# Patient Record
Sex: Female | Born: 1995 | Hispanic: Yes | Marital: Married | Smoking: Never smoker
Health system: Southern US, Community
[De-identification: ages and names within clinical notes are randomized; demographics above are authoritative.]

## PROBLEM LIST (undated history)

## (undated) ENCOUNTER — Inpatient Hospital Stay (HOSPITAL_COMMUNITY): Payer: Self-pay

## (undated) DIAGNOSIS — Z789 Other specified health status: Secondary | ICD-10-CM

## (undated) HISTORY — PX: CHOLECYSTECTOMY: SHX55

## (undated) HISTORY — DX: Other specified health status: Z78.9

---

## 2017-03-24 ENCOUNTER — Other Ambulatory Visit: Payer: Self-pay

## 2017-03-24 ENCOUNTER — Emergency Department (HOSPITAL_COMMUNITY)
Admission: EM | Admit: 2017-03-24 | Discharge: 2017-03-24 | Disposition: A | Discharge status: Home / Self Care | Payer: Medicaid Other | Attending: Emergency Medicine | Admitting: Emergency Medicine

## 2017-03-24 ENCOUNTER — Encounter (HOSPITAL_COMMUNITY): Payer: Self-pay | Admitting: Emergency Medicine

## 2017-03-24 DIAGNOSIS — J069 Acute upper respiratory infection, unspecified: Secondary | ICD-10-CM

## 2017-03-24 DIAGNOSIS — J029 Acute pharyngitis, unspecified: Secondary | ICD-10-CM

## 2017-03-24 MED ORDER — DEXAMETHASONE 4 MG PO TABS: 4.0000 mg | ORAL_TABLET | Freq: Two times a day (BID) | ORAL | 0 refills | Status: DC

## 2017-03-24 MED ORDER — IBUPROFEN 600 MG PO TABS: 600.0000 mg | ORAL_TABLET | Freq: Four times a day (QID) | ORAL | 0 refills | Status: DC

## 2017-03-24 MED ORDER — LORATADINE-PSEUDOEPHEDRINE ER 5-120 MG PO TB12: 1.0000 | ORAL_TABLET | Freq: Two times a day (BID) | ORAL | 0 refills | Status: DC

## 2017-03-24 NOTE — Discharge Instructions (Signed)
Please use your mask until symptoms resolve. Please increase fluids. Use ibuprofen every 6 hours for fever, chills, or aching. Use salt water gargles and Chloraseptic spray for assistance with your throat pain. Use decadron and claritin D 2 times daily. Wash hands frequently. See your MD or return to the ED if any changes or problem.

## 2017-03-24 NOTE — ED Provider Notes (Signed)
[facility] EMERGENCY DEPARTMENT Provider Note   CSN: [id number] Arrival date & time: 03/24/17  1508     History   Chief Complaint Chief Complaint  Patient presents with  . Sore Throat    HPI Michelle Larson is a 22 y.o. female.  The history is provided by the patient.  Sore Throat  This is a new problem. The current episode started yesterday. The problem occurs hourly. The problem has been gradually worsening. Associated symptoms include headaches. Pertinent negatives include no chest pain, no abdominal pain and no shortness of breath. The symptoms are aggravated by swallowing. Nothing relieves the symptoms. Treatments tried: TheraFlu. The treatment provided no relief.    History reviewed. No pertinent past medical history.  There are no active problems to display for this patient.   Past Surgical History:  Procedure Laterality Date  . CHOLECYSTECTOMY      OB History    No data available       Home Medications    Prior to Admission medications   Not on File    Family History History reviewed. No pertinent family history.  Social History Social History   Tobacco Use  . Smoking status: Never Smoker  . Smokeless tobacco: Never Used  Substance Use Topics  . Alcohol use: No    Frequency: Never  . Drug use: No     Allergies   Patient has no known allergies.   Review of Systems Review of Systems  Constitutional: Positive for chills. Negative for activity change.       All ROS Neg except as noted in HPI  HENT: Positive for congestion, sneezing, sore throat and trouble swallowing. Negative for nosebleeds.   Eyes: Negative for photophobia and discharge.  Respiratory: Negative for cough, shortness of breath and wheezing.   Cardiovascular: Negative for chest pain and palpitations.  Gastrointestinal: Negative for abdominal pain and blood in stool.  Genitourinary: Negative for dysuria, frequency and hematuria.  Musculoskeletal: Positive for myalgias.  Negative for arthralgias, back pain and neck pain.  Skin: Negative.   Neurological: Positive for headaches. Negative for dizziness, seizures and speech difficulty.  Psychiatric/Behavioral: Negative for confusion and hallucinations.     Physical Exam Updated Vital Signs BP 106/64 (BP Location: Left Arm)   Pulse 99   Temp 98.3 F (36.8 C) (Oral)   Resp 16   Ht 5\' 2"  (1.575 m)   Wt 68 kg (150 lb)   LMP 02/17/2017   SpO2 100%   BMI 27.44 kg/m   Physical Exam  Constitutional: She is oriented to person, place, and time. She appears well-developed and well-nourished.  Non-toxic appearance.  HENT:  Head: Normocephalic.  Right Ear: Tympanic membrane and external ear normal.  Left Ear: Tympanic membrane and external ear normal.  Mouth/Throat: Uvula is midline. Posterior oropharyngeal erythema present. No tonsillar abscesses.  Nasal congestion present.  Eyes: Pupils are equal, round, and reactive to light. EOM and lids are normal.  Neck: Normal range of motion. Neck supple. Carotid bruit is not present.  Cardiovascular: Normal rate, regular rhythm, normal heart sounds, intact distal pulses and normal pulses.  Pulmonary/Chest: Breath sounds normal. No respiratory distress.  Abdominal: Soft. Bowel sounds are normal. There is no tenderness. There is no guarding.  Musculoskeletal: Normal range of motion.  Lymphadenopathy:       Head (right side): No submandibular adenopathy present.       Head (left side): No submandibular adenopathy present.    She has no cervical adenopathy.  Neurological:  She is alert and oriented to person, place, and time. She has normal strength. No cranial nerve deficit or sensory deficit.  Skin: Skin is warm and dry.  Psychiatric: She has a normal mood and affect. Her speech is normal.  Nursing note and vitals reviewed.    ED Treatments / Results  Labs (all labs ordered are listed, but only abnormal results are displayed) Labs Reviewed - No data to  display  EKG  EKG Interpretation None       Radiology No results found.  Procedures Procedures (including critical care time)  Medications Ordered in ED Medications - No data to display   Initial Impression / Assessment and Plan / ED Course  I have reviewed the triage vital signs and the nursing notes.  Pertinent labs & imaging results that were available during my care of the patient were reviewed by me and considered in my medical decision making (see chart for details).       Final Clinical Impressions(s) / ED Diagnoses MDm  Vital signs reviewed.  Pulse oximetry is 100% on room air.  Within normal limits by my interpretation. Examination favors upper respiratory infection.  Of asked the patient to use salt water gargles and Chloraseptic spray.  Patient will use Tylenol every 4 hours or ibuprofen every 6 hours for fever and/or aching.  Decongesting medication and steroids also suggested to the patient.  We discussed the importance of good handwashing.  We also discussed the importance of good hydration.  The patient is to return to the emergency department if any changes in condition, problems, or concerns.  Patient is in agreement with this plan.   Final diagnoses:  Pharyngitis, unspecified etiology  Upper respiratory tract infection, unspecified type    ED Discharge Orders        Ordered    loratadine-pseudoephedrine (CLARITIN-D 12 HOUR) 5-120 MG tablet  2 times daily     03/24/17 1616    dexamethasone (DECADRON) 4 MG tablet  2 times daily with meals     03/24/17 1616    ibuprofen (ADVIL,MOTRIN) 600 MG tablet  4 times daily     03/24/17 1616       Ivery QualeBryant, Temeka Pore, PA-C 03/25/17 1412    Raeford RazorKohut, Stephen, MD 04/01/17 [phone]

## 2017-03-24 NOTE — ED Triage Notes (Signed)
PT c/o nasal congestion and sore throat x2 days unrelieved by OTC medications.

## 2017-07-27 DIAGNOSIS — Z130 Encounter for screening for diseases of the blood and blood-forming organs and certain disorders involving the immune mechanism: Secondary | ICD-10-CM | POA: Diagnosis not present

## 2017-07-27 DIAGNOSIS — H547 Unspecified visual loss: Secondary | ICD-10-CM | POA: Diagnosis not present

## 2017-07-27 DIAGNOSIS — J339 Nasal polyp, unspecified: Secondary | ICD-10-CM | POA: Diagnosis not present

## 2017-07-27 DIAGNOSIS — Z0000 Encounter for general adult medical examination without abnormal findings: Secondary | ICD-10-CM | POA: Diagnosis not present

## 2017-07-27 DIAGNOSIS — Z113 Encounter for screening for infections with a predominantly sexual mode of transmission: Secondary | ICD-10-CM | POA: Diagnosis not present

## 2017-07-27 DIAGNOSIS — Z13228 Encounter for screening for other metabolic disorders: Secondary | ICD-10-CM | POA: Diagnosis not present

## 2017-07-27 DIAGNOSIS — Z13220 Encounter for screening for lipoid disorders: Secondary | ICD-10-CM | POA: Diagnosis not present

## 2017-08-11 DIAGNOSIS — H5213 Myopia, bilateral: Secondary | ICD-10-CM | POA: Diagnosis not present

## 2017-08-11 DIAGNOSIS — H52223 Regular astigmatism, bilateral: Secondary | ICD-10-CM | POA: Diagnosis not present

## 2017-08-31 DIAGNOSIS — J343 Hypertrophy of nasal turbinates: Secondary | ICD-10-CM | POA: Diagnosis not present

## 2017-10-05 DIAGNOSIS — Z30432 Encounter for removal of intrauterine contraceptive device: Secondary | ICD-10-CM | POA: Diagnosis not present

## 2018-04-21 DIAGNOSIS — F419 Anxiety disorder, unspecified: Secondary | ICD-10-CM | POA: Diagnosis not present

## 2018-04-21 DIAGNOSIS — R454 Irritability and anger: Secondary | ICD-10-CM | POA: Diagnosis not present

## 2018-05-19 ENCOUNTER — Emergency Department (HOSPITAL_COMMUNITY): Payer: Medicaid Other

## 2018-05-19 ENCOUNTER — Emergency Department (HOSPITAL_COMMUNITY)
Admission: EM | Admit: 2018-05-19 | Discharge: 2018-05-19 | Disposition: A | Discharge status: Home / Self Care | Payer: Medicaid Other | Attending: Emergency Medicine | Admitting: Emergency Medicine

## 2018-05-19 ENCOUNTER — Encounter (HOSPITAL_COMMUNITY): Payer: Self-pay | Admitting: Emergency Medicine

## 2018-05-19 ENCOUNTER — Other Ambulatory Visit: Payer: Self-pay

## 2018-05-19 DIAGNOSIS — O26851 Spotting complicating pregnancy, first trimester: Secondary | ICD-10-CM | POA: Diagnosis not present

## 2018-05-19 DIAGNOSIS — O2340 Unspecified infection of urinary tract in pregnancy, unspecified trimester: Secondary | ICD-10-CM | POA: Insufficient documentation

## 2018-05-19 DIAGNOSIS — O200 Threatened abortion: Secondary | ICD-10-CM | POA: Diagnosis not present

## 2018-05-19 DIAGNOSIS — B9689 Other specified bacterial agents as the cause of diseases classified elsewhere: Secondary | ICD-10-CM | POA: Diagnosis not present

## 2018-05-19 DIAGNOSIS — R8271 Bacteriuria: Secondary | ICD-10-CM | POA: Diagnosis not present

## 2018-05-19 DIAGNOSIS — Z3A00 Weeks of gestation of pregnancy not specified: Secondary | ICD-10-CM | POA: Insufficient documentation

## 2018-05-19 DIAGNOSIS — O4690 Antepartum hemorrhage, unspecified, unspecified trimester: Secondary | ICD-10-CM

## 2018-05-19 DIAGNOSIS — O209 Hemorrhage in early pregnancy, unspecified: Secondary | ICD-10-CM | POA: Diagnosis present

## 2018-05-19 DIAGNOSIS — O9989 Other specified diseases and conditions complicating pregnancy, childbirth and the puerperium: Secondary | ICD-10-CM | POA: Diagnosis not present

## 2018-05-19 DIAGNOSIS — O99891 Other specified diseases and conditions complicating pregnancy: Secondary | ICD-10-CM

## 2018-05-19 LAB — CBC
HCT: 42.6 % (ref 36.0–46.0)
Hemoglobin: 14.2 g/dL (ref 12.0–15.0)
MCH: 31.6 pg (ref 26.0–34.0)
MCHC: 33.3 g/dL (ref 30.0–36.0)
MCV: 94.7 fL (ref 80.0–100.0)
Platelets: 227 10*3/uL (ref 150–400)
RBC: 4.5 MIL/uL (ref 3.87–5.11)
RDW: 12.2 % (ref 11.5–15.5)
WBC: 7.8 10*3/uL (ref 4.0–10.5)
nRBC: 0 % (ref 0.0–0.2)

## 2018-05-19 LAB — URINALYSIS, ROUTINE W REFLEX MICROSCOPIC
Bilirubin Urine: NEGATIVE
Glucose, UA: NEGATIVE mg/dL
Ketones, ur: 20 mg/dL — AB
Nitrite: NEGATIVE
Protein, ur: NEGATIVE mg/dL
Specific Gravity, Urine: 1.019 (ref 1.005–1.030)
pH: 5 (ref 5.0–8.0)

## 2018-05-19 LAB — COMPREHENSIVE METABOLIC PANEL
ALT: 24 U/L (ref 0–44)
AST: 16 U/L (ref 15–41)
Albumin: 4.3 g/dL (ref 3.5–5.0)
Alkaline Phosphatase: 64 U/L (ref 38–126)
Anion gap: 9 (ref 5–15)
BUN: 11 mg/dL (ref 6–20)
CO2: 23 mmol/L (ref 22–32)
Calcium: 8.9 mg/dL (ref 8.9–10.3)
Chloride: 105 mmol/L (ref 98–111)
Creatinine, Ser: 0.48 mg/dL (ref 0.44–1.00)
GFR calc Af Amer: >60 mL/min (ref >60)
GFR calc non Af Amer: >60 mL/min (ref >60)
Glucose, Bld: 81 mg/dL (ref 70–99)
Potassium: 3.4 mmol/L — ABNORMAL LOW (ref 3.5–5.1)
Sodium: 137 mmol/L (ref 135–145)
Total Bilirubin: 0.6 mg/dL (ref 0.3–1.2)
Total Protein: 7.6 g/dL (ref 6.5–8.1)

## 2018-05-19 LAB — WET PREP, GENITAL
Clue Cells Wet Prep HPF POC: NONE SEEN
Sperm: NONE SEEN
Trich, Wet Prep: NONE SEEN
Yeast Wet Prep HPF POC: NONE SEEN

## 2018-05-19 LAB — HCG, QUANTITATIVE, PREGNANCY: hCG, Beta Chain, Quant, S: 57 m[IU]/mL — ABNORMAL HIGH (ref <5)

## 2018-05-19 MED ORDER — CEPHALEXIN 500 MG PO CAPS: 500.0000 mg | ORAL_CAPSULE | Freq: Two times a day (BID) | ORAL | 0 refills | Status: AC

## 2018-05-19 NOTE — Discharge Instructions (Signed)
You were seen in the ED today for vaginal bleeding with pregnancy; your beta hcg level was elevated indicating that you are pregnant; it is too early to see the embryo on ultrasound today .Please call [facility] tomorrow morning for a repeat blood test in 48 hours; it is also recommended to have a repeat ultrasound in 10-14 days to determine the location of the embryo.   Please take the Keflex as prescribed for the bacteria found in your urine. It is recommended that you get a repeat urine test after you finish your course of antibiotics to ensure that there is no more bacteria. Return to the ED for any worsening symptoms.

## 2018-05-19 NOTE — ED Notes (Signed)
Pt given crackers, peanut butter and gingerale 

## 2018-05-19 NOTE — ED Notes (Signed)
Patient back from ultrasound

## 2018-05-19 NOTE — ED Notes (Signed)
PELVIC CART AT BEDSIDE 

## 2018-05-19 NOTE — ED Notes (Signed)
Patient transported to Ultrasound 

## 2018-05-19 NOTE — ED Provider Notes (Signed)
[facility] EMERGENCY DEPARTMENT Provider Note   CSN: [id number] Arrival date & time: 05/19/18  1731    History   Chief Complaint Chief Complaint  Patient presents with   Vaginal Bleeding    HPI Michelle Larson is a 23 y.o. G3P2002 currently pregant  female who presents to the ED complaining of vaginal bleeding that began today. Pt reports she took 2 at home pregnancy tests 3 days ago which were positive. She then noticed a small amount of vaginal bleeding today which concerned her. Pt wears a liner normally and noticed a small amount of blood; has not soaked through the pad. Denies pelvic pain, abdominal pain, fever, chills, nausea, vomiting, dysuria, frequency, or any other associated symptoms.       History reviewed. No pertinent past medical history.  There are no active problems to display for this patient.   Past Surgical History:  Procedure Laterality Date   CHOLECYSTECTOMY       OB History    Gravida  1   Para      Term      Preterm      AB      Living        SAB      TAB      Ectopic      Multiple      Live Births               Home Medications    Prior to Admission medications   Medication Sig Start Date End Date Taking? Authorizing Provider  cetirizine (ZYRTEC) 10 MG tablet Take 10 mg by mouth daily.   Yes [provider]  cephALEXin (KEFLEX) 500 MG capsule Take 1 capsule (500 mg total) by mouth 2 (two) times daily for 7 days. 05/19/18 05/26/18  Hyman Hopes, [place] Shannahan, PA-C  dexamethasone (DECADRON) 4 MG tablet Take 1 tablet (4 mg total) by mouth 2 (two) times daily with a meal. Patient not taking: Reported on 05/19/2018 03/24/17   Ivery Quale, PA-C  ibuprofen (ADVIL,MOTRIN) 600 MG tablet Take 1 tablet (600 mg total) by mouth 4 (four) times daily. Patient not taking: Reported on 05/19/2018 03/24/17   Ivery Quale, PA-C  loratadine-pseudoephedrine (CLARITIN-D 12 HOUR) 5-120 MG tablet Take 1 tablet by mouth 2 (two) times  daily. Patient not taking: Reported on 05/19/2018 03/24/17   Ivery Quale, PA-C    Family History No family history on file.  Social History Social History   Tobacco Use   Smoking status: Never Smoker   Smokeless tobacco: Never Used  Substance Use Topics   Alcohol use: No    Frequency: Never   Drug use: No     Allergies   Patient has no known allergies.   Review of Systems Review of Systems  Constitutional: Negative for chills and fever.  HENT: Negative for congestion.   Eyes: Negative for redness.  Respiratory: Negative for cough and shortness of breath.   Cardiovascular: Negative for chest pain.  Gastrointestinal: Negative for abdominal pain, constipation, diarrhea, nausea and vomiting.  Genitourinary: Positive for vaginal bleeding. Negative for dysuria, frequency, pelvic pain, urgency and vaginal discharge.  Musculoskeletal: Negative for myalgias.  Skin: Negative for rash.  Neurological: Negative for headaches.     Physical Exam Updated Vital Signs BP 115/68 (BP Location: Right Arm)    Pulse 88    Temp 98.7 F (37.1 C) (Oral)    Resp 18    Ht 5\' 2"  (1.575 m)    Wt 81.6 kg  LMP 04/07/2018    SpO2 100%    BMI 32.92 kg/m   Physical Exam Vitals signs and nursing note reviewed. Exam conducted with a chaperone present.  Constitutional:      Appearance: She is not ill-appearing.  HENT:     Head: Normocephalic and atraumatic.  Eyes:     Conjunctiva/sclera: Conjunctivae normal.  Neck:     Musculoskeletal: Neck supple.  Cardiovascular:     Rate and Rhythm: Normal rate and regular rhythm.     Pulses: Normal pulses.  Pulmonary:     Effort: Pulmonary effort is normal.     Breath sounds: Normal breath sounds. No wheezing, rhonchi or rales.  Abdominal:     Palpations: Abdomen is soft.     Tenderness: There is no abdominal tenderness. There is no guarding or rebound.  Genitourinary:    Comments: Female chaperone present; normal external genitalia; small  amount of blood in vaginal vault; os is closed; no adnexal or cervical motion tenderness on bimanual exam Skin:    General: Skin is warm and dry.  Neurological:     Mental Status: She is alert.      ED Treatments / Results  Labs (all labs ordered are listed, but only abnormal results are displayed) Labs Reviewed  WET PREP, GENITAL - Abnormal; Notable for the following components:      Result Value   WBC, Wet Prep HPF POC RARE (*)    All other components within normal limits  COMPREHENSIVE METABOLIC PANEL - Abnormal; Notable for the following components:   Potassium 3.4 (*)    All other components within normal limits  HCG, QUANTITATIVE, PREGNANCY - Abnormal; Notable for the following components:   hCG, Beta Chain, Quant, S 57 (*)    All other components within normal limits  URINALYSIS, ROUTINE W REFLEX MICROSCOPIC - Abnormal; Notable for the following components:   APPearance HAZY (*)    Hgb urine dipstick LARGE (*)    Ketones, ur 20 (*)    Leukocytes,Ua TRACE (*)    Bacteria, UA RARE (*)    All other components within normal limits  CBC  RPR  HIV ANTIBODY (ROUTINE TESTING W REFLEX)  GC/CHLAMYDIA PROBE AMP ([facility]) NOT AT [facility]    EKG None  Radiology US Ob Comp < 14 Wks  Result Date: 05/19/2018 CLINICAL DATA:  Pregnant patient in first-trimester pregnancy with vaginal bleeding. EXAM: OBSTETRIC <14 WK [place] AND TRANSVAGINAL OB US TECHNIQUE: Both transabdominal and transvaginal ultrasound examinations were performed for complete evaluation of the gestation as well as the maternal uterus, adnexal regions, and pelvic cul-de-sac. Transvaginal technique was performed to assess early pregnancy. COMPARISON:  None. FINDINGS: Intrauterine gestational sac: None Yolk sac:  Not Visualized. Embryo:  Not Visualized. Cardiac Activity: Not Visualized. Subchorionic hemorrhage:  Not applicable. Maternal uterus/adnexae: The uterus is anteverted measuring 9.3 x 4.7 x 5.3 cm. Distal endometrial  thickening at 19.5 mm. No fluid or gestational sac in the endometrium. Right ovary is normal in size and contains a 1.4 cm corpus luteum. Left ovary is normal in size. Blood flow noted to both ovaries. No adnexal mass. Trace pelvic free fluid. IMPRESSION: No intrauterine pregnancy or findings suspicious for ectopic pregnancy. Findings are consistent with pregnancy of unknown location and may reflect early intrauterine pregnancy not yet visualized sonographically, occult ectopic pregnancy, or failed pregnancy. Recommend trending of beta HCG and follow-up ultrasound in 10-14 days. Electronically Signed   By: Narda Rutherford M.D.   On: 05/19/2018 21:40  [place] Ob Transvaginal  Result Date: 05/19/2018 CLINICAL DATA:  Pregnant patient in first-trimester pregnancy with vaginal bleeding. EXAM: OBSTETRIC <14 WK US AND TRANSVAGINAL OB US TECHNIQUE: Both transabdominal and transvaginal ultrasound examinations were performed for complete evaluation of the gestation as well as the maternal uterus, adnexal regions, and pelvic cul-de-sac. Transvaginal technique was performed to assess early pregnancy. COMPARISON:  None. FINDINGS: Intrauterine gestational sac: None Yolk sac:  Not Visualized. Embryo:  Not Visualized. Cardiac Activity: Not Visualized. Subchorionic hemorrhage:  Not applicable. Maternal uterus/adnexae: The uterus is anteverted measuring 9.3 x 4.7 x 5.3 cm. Distal endometrial thickening at 19.5 mm. No fluid or gestational sac in the endometrium. Right ovary is normal in size and contains a 1.4 cm corpus luteum. Left ovary is normal in size. Blood flow noted to both ovaries. No adnexal mass. Trace pelvic free fluid. IMPRESSION: No intrauterine pregnancy or findings suspicious for ectopic pregnancy. Findings are consistent with pregnancy of unknown location and may reflect early intrauterine pregnancy not yet visualized sonographically, occult ectopic pregnancy, or failed pregnancy. Recommend trending of beta HCG and  follow-up ultrasound in 10-14 days. Electronically Signed   By: Narda RutherfordMelanie  Sanford M.D.   On: 05/19/2018 21:40    Procedures Procedures (including critical care time)  Medications Ordered in ED Medications - No data to display   Initial Impression / Assessment and Plan / ED Course  I have reviewed the triage vital signs and the nursing notes.  Pertinent labs & imaging results that were available during my care of the patient were reviewed by me and considered in my medical decision making (see chart for details).  Pt is a 23 year old female who is currently pregnant presenting to the ED with vaginal bleeding. Has had 2 at home pregnancy tests this week; no confirmed blood test yet. Not complaining of any pain at the moment. HCG positive at 57 today. Will get ultrasound to rule out ectopic pregnancy at this point as well as screening labs/wet prep and pelvic exam.    U/A with trace leuks; will treat asymptomatic bacteruria given patient is pregnant. Wet prep without findings of trich, yeast, or BV. Ultrasound without findings of pregnancy; probably too early to visualize. Advised pt to follow up with OBGYN for repeat beta hcg in 48 hours as well as repeat ultrasound in 2 weeks. Pt in agreement with plan and stable for discharge home.        Final Clinical Impressions(s) / ED Diagnoses   Final diagnoses:  Asymptomatic bacteriuria during pregnancy  Vaginal bleeding in pregnancy  Threatened miscarriage    ED Discharge Orders         Ordered    cephALEXin (KEFLEX) 500 MG capsule  2 times daily     05/19/18 2157           Tanda RockersVenter, Joesiah Lonon, PA-C 05/20/18 0041    Raeford RazorKohut, Stephen, MD 05/23/18 [phone]

## 2018-05-19 NOTE — ED Triage Notes (Signed)
Had 2 pos home preg test this week.  Having vaginal bleeding that started today.  Has not bleed through more than one pad in an hour.  Reports bright red blood on toilet paper and some in the toilet with no clots.

## 2018-05-20 ENCOUNTER — Telehealth: Payer: Self-pay | Admitting: *Deleted

## 2018-05-20 ENCOUNTER — Telehealth: Payer: Self-pay | Admitting: Women's Health

## 2018-05-20 NOTE — Telephone Encounter (Signed)
pleae call needs to speak to a nurse to follow up her her er viist at [facility]

## 2018-05-20 NOTE — Telephone Encounter (Signed)
Informed patient that based on her u/s and lab work she needs repeat on Monday and depending on the number is rising, she will need repeat u/s in 10-14 days.  Placed on lab schedule for Monday

## 2018-05-21 LAB — HIV ANTIBODY (ROUTINE TESTING W REFLEX): HIV Screen 4th Generation wRfx: NONREACTIVE

## 2018-05-21 LAB — RPR: RPR Ser Ql: NONREACTIVE

## 2018-05-23 ENCOUNTER — Other Ambulatory Visit: Payer: Medicaid Other

## 2018-05-23 ENCOUNTER — Other Ambulatory Visit: Payer: Self-pay | Admitting: Obstetrics & Gynecology

## 2018-05-23 ENCOUNTER — Other Ambulatory Visit: Payer: Self-pay

## 2018-05-23 DIAGNOSIS — Z3490 Encounter for supervision of normal pregnancy, unspecified, unspecified trimester: Secondary | ICD-10-CM | POA: Diagnosis not present

## 2018-05-23 LAB — GC/CHLAMYDIA PROBE AMP (~~LOC~~) NOT AT ARMC
Chlamydia: NEGATIVE
Neisseria Gonorrhea: NEGATIVE

## 2018-05-24 ENCOUNTER — Telehealth: Payer: Self-pay | Admitting: Adult Health

## 2018-05-24 LAB — BETA HCG QUANT (REF LAB): hCG Quant: 30 m[IU]/mL

## 2018-05-24 NOTE — Telephone Encounter (Signed)
Patient called stating that she would like to know the results of her blood work.  °Please contact pt °

## 2018-05-25 NOTE — Telephone Encounter (Signed)
Called patient back and answered questions about dropping hcg levels. Advised that she needs repeat to ensure levels return to 0.

## 2018-05-25 NOTE — Telephone Encounter (Signed)
Pt requesting a call from a nurse to discuss her results in detail.

## 2018-05-25 NOTE — Telephone Encounter (Signed)
Left message that level is dropping and that she needs repeat in 1 week.

## 2018-05-27 ENCOUNTER — Encounter: Payer: Medicaid Other | Admitting: Adult Health

## 2018-05-31 ENCOUNTER — Encounter: Payer: Medicaid Other | Admitting: Adult Health

## 2018-06-01 ENCOUNTER — Other Ambulatory Visit: Payer: Medicaid Other

## 2018-06-01 DIAGNOSIS — O039 Complete or unspecified spontaneous abortion without complication: Secondary | ICD-10-CM

## 2018-06-02 LAB — BETA HCG QUANT (REF LAB): hCG Quant: 3 m[IU]/mL

## 2018-10-06 ENCOUNTER — Ambulatory Visit: Payer: Medicaid Other | Admitting: *Deleted

## 2018-10-06 ENCOUNTER — Other Ambulatory Visit: Payer: Self-pay

## 2018-10-06 VITALS — BP 112/67 | HR 94 | Ht 62.0 in | Wt 189.0 lb

## 2018-10-06 DIAGNOSIS — O3680X0 Pregnancy with inconclusive fetal viability, not applicable or unspecified: Secondary | ICD-10-CM

## 2018-10-06 DIAGNOSIS — Z3201 Encounter for pregnancy test, result positive: Secondary | ICD-10-CM | POA: Diagnosis not present

## 2018-10-06 LAB — POCT URINE PREGNANCY: Preg Test, Ur: POSITIVE — AB

## 2018-10-06 NOTE — Progress Notes (Signed)
   NURSE VISIT- PREGNANCY CONFIRMATION   SUBJECTIVE:  Michelle Larson is a 23 y.o. [phone] female at [redacted]w[redacted]d by certain LMP of Patient's last menstrual period was 08/25/2018 (exact date). Here for pregnancy confirmation.  Home pregnancy test: positive x 1  She reports no complaints.  She is taking prenatal vitamins.    OBJECTIVE:  BP 112/67 (BP Location: Left Arm, Patient Position: Sitting, Cuff Size: Normal)   Pulse 94   Ht 5\' 2"  (1.575 m)   Wt 189 lb (85.7 kg)   LMP 08/25/2018 (Exact Date)   BMI 34.57 kg/m   Appears well, in no apparent distress OB History  Gravida Para Term Preterm AB Living  4 2 2   1 2   SAB TAB Ectopic Multiple Live Births  1       2    # Outcome Date GA Lbr Len/2nd Weight Sex Delivery Anes PTL Lv  4 Current           3 SAB 06/2018          2 Term 02/18/16 [redacted]w[redacted]d  8 lb 7 oz (3.827 kg) M Vag-Spont  N LIV  1 Term 08/25/12 [redacted]w[redacted]d  8 lb 12 oz (3.969 kg) F Vag-Spont  N LIV    Results for orders placed or performed in visit on 10/06/18 (from the past 24 hour(s))  POCT urine pregnancy   Collection Time: 10/06/18  1:48 PM  Result Value Ref Range   Preg Test, Ur Positive (A) Negative    ASSESSMENT: Positive pregnancy test, [redacted]w[redacted]d by LMP    PLAN: Schedule for dating ultrasound in 1-2 weeks Prenatal vitamins: continue   Nausea medicines: not currently needed   OB packet given: Yes  Latisha Cresenzo  10/06/2018 1:51 PM

## 2018-10-12 ENCOUNTER — Ambulatory Visit (INDEPENDENT_AMBULATORY_CARE_PROVIDER_SITE_OTHER): Payer: Medicaid Other

## 2018-10-12 ENCOUNTER — Other Ambulatory Visit: Payer: Self-pay

## 2018-10-12 DIAGNOSIS — O3680X0 Pregnancy with inconclusive fetal viability, not applicable or unspecified: Secondary | ICD-10-CM

## 2018-10-12 DIAGNOSIS — Z3A01 Less than 8 weeks gestation of pregnancy: Secondary | ICD-10-CM

## 2018-10-12 NOTE — Progress Notes (Signed)
Chart reviewed for nurse visit. Agree with plan of care.   Griffin, Jennifer A, NP 10/12/2018 3:12 PM  

## 2018-10-12 NOTE — Progress Notes (Signed)
[place] 6+6 wks,single IUP w/ys,positive fht 116 bpm,normal ovaries bilat,crl 4.25 mm

## 2018-10-14 ENCOUNTER — Telehealth: Payer: Self-pay | Admitting: *Deleted

## 2018-10-14 NOTE — Telephone Encounter (Signed)
Patient states she had an "incident" yesterday and would like to come in for an ultrasound today to check on the pregnancy.  When asked what type of incident patient stated she was attempting to kill a fly and was moving quickly throughout her house.  Denies bleeding but is cramping today. Informed patient she has probably pulled a muscle or exerted that area which causing the cramping.  Pt is worried as she has had a miscarriage earlier this year. Informed patient that unfortunately, there is nothing we can do at this time to prevent a miscarriage but we can check the heartbeat if she would like.  Pt stated she would and will come later today.

## 2018-11-01 ENCOUNTER — Other Ambulatory Visit: Payer: Self-pay | Admitting: Obstetrics and Gynecology

## 2018-11-01 DIAGNOSIS — O3680X0 Pregnancy with inconclusive fetal viability, not applicable or unspecified: Secondary | ICD-10-CM

## 2018-11-02 ENCOUNTER — Other Ambulatory Visit: Payer: Self-pay

## 2018-11-02 ENCOUNTER — Ambulatory Visit (INDEPENDENT_AMBULATORY_CARE_PROVIDER_SITE_OTHER): Payer: Medicaid Other

## 2018-11-02 DIAGNOSIS — O3680X0 Pregnancy with inconclusive fetal viability, not applicable or unspecified: Secondary | ICD-10-CM

## 2018-11-02 DIAGNOSIS — Z3A09 9 weeks gestation of pregnancy: Secondary | ICD-10-CM | POA: Diagnosis not present

## 2018-11-02 NOTE — Progress Notes (Signed)
[place] 9+6 wks,measurements c/w dates,crl 25.66 mm,normal ovaries bilat,fhr 169 bpm

## 2018-11-23 ENCOUNTER — Ambulatory Visit: Payer: Medicaid Other | Admitting: *Deleted

## 2018-11-23 ENCOUNTER — Other Ambulatory Visit: Payer: Medicaid Other

## 2018-11-24 ENCOUNTER — Other Ambulatory Visit: Payer: Medicaid Other

## 2018-11-24 ENCOUNTER — Ambulatory Visit: Payer: Medicaid Other | Admitting: *Deleted

## 2018-11-24 ENCOUNTER — Encounter: Payer: Medicaid Other | Admitting: Advanced Practice Midwife

## 2018-12-07 ENCOUNTER — Ambulatory Visit: Payer: Medicaid Other | Admitting: *Deleted

## 2018-12-07 ENCOUNTER — Ambulatory Visit (INDEPENDENT_AMBULATORY_CARE_PROVIDER_SITE_OTHER): Payer: Medicaid Other | Admitting: Advanced Practice Midwife

## 2018-12-07 ENCOUNTER — Encounter: Payer: Self-pay | Admitting: Advanced Practice Midwife

## 2018-12-07 ENCOUNTER — Other Ambulatory Visit: Payer: Self-pay

## 2018-12-07 VITALS — BP 114/76 | HR 82 | Wt 185.0 lb

## 2018-12-07 DIAGNOSIS — Z3482 Encounter for supervision of other normal pregnancy, second trimester: Secondary | ICD-10-CM

## 2018-12-07 DIAGNOSIS — Z331 Pregnant state, incidental: Secondary | ICD-10-CM

## 2018-12-07 DIAGNOSIS — Z130 Encounter for screening for diseases of the blood and blood-forming organs and certain disorders involving the immune mechanism: Secondary | ICD-10-CM

## 2018-12-07 DIAGNOSIS — Z3490 Encounter for supervision of normal pregnancy, unspecified, unspecified trimester: Secondary | ICD-10-CM | POA: Insufficient documentation

## 2018-12-07 DIAGNOSIS — Z1389 Encounter for screening for other disorder: Secondary | ICD-10-CM | POA: Diagnosis not present

## 2018-12-07 DIAGNOSIS — Z0283 Encounter for blood-alcohol and blood-drug test: Secondary | ICD-10-CM | POA: Diagnosis not present

## 2018-12-07 DIAGNOSIS — Z23 Encounter for immunization: Secondary | ICD-10-CM

## 2018-12-07 DIAGNOSIS — Z3A14 14 weeks gestation of pregnancy: Secondary | ICD-10-CM

## 2018-12-07 LAB — POCT URINALYSIS DIPSTICK OB
Blood, UA: NEGATIVE
Glucose, UA: NEGATIVE
Ketones, UA: NEGATIVE
Leukocytes, UA: NEGATIVE
Nitrite, UA: NEGATIVE
POC,PROTEIN,UA: NEGATIVE

## 2018-12-07 MED ORDER — DOXYLAMINE-PYRIDOXINE 10-10 MG PO TBEC: DELAYED_RELEASE_TABLET | ORAL | 3 refills | Status: DC

## 2018-12-07 MED ORDER — BLOOD PRESSURE MONITOR MISC: 0 refills | Status: DC

## 2018-12-07 MED ORDER — ONDANSETRON 4 MG PO TBDP: 4.0000 mg | ORAL_TABLET | Freq: Three times a day (TID) | ORAL | 1 refills | Status: DC | PRN

## 2018-12-07 NOTE — Patient Instructions (Signed)
[facility], I greatly value your feedback.  If you receive a survey following your visit with [place] today, we appreciate you taking the time to fill it out.  Thanks, Derrill Memo, [place]!!! It is now [place] [facility] ([place], [place] [address]) Entrance located off of [address] parking   Nausea & Vomiting  Have saltine crackers or pretzels by your bed and eat a few bites before you raise your head out of bed in the morning  Eat small frequent meals throughout the day instead of large meals  Drink plenty of fluids throughout the day to stay hydrated, just don't drink a lot of fluids with your meals.  This can make your stomach fill up faster making you feel sick  Do not brush your teeth right after you eat  Products with real ginger are good for nausea, like ginger ale and ginger hard candy Make sure it says made with real ginger!  Sucking on sour candy like lemon heads is also good for nausea  If your prenatal vitamins make you nauseated, take them at night so you will sleep through the nausea  Sea Bands  If you feel like you need medicine for the nausea & vomiting please let us know  If you are unable to keep any fluids or food down please let us know   Constipation  Drink plenty of fluid, preferably water, throughout the day  Eat foods high in fiber such as fruits, vegetables, and grains  Exercise, such as walking, is a good way to keep your bowels regular  Drink warm fluids, especially warm prune juice, or decaf coffee  Eat a 1/2 cup of real oatmeal (not instant), 1/2 cup applesauce, and 1/2-1 cup warm prune juice every day  If needed, you may take Colace (docusate sodium) stool softener once or twice a day to help keep the stool soft.   If you still are having problems with constipation, you may take Miralax once daily as needed to help keep your bowels regular.   Home Blood  Pressure Monitoring for Patients   Your provider has recommended that you check your blood pressure (BP) at least once a week at home. If you do not have a blood pressure cuff at home, one will be provided for you. Contact your provider if you have not received your monitor within 1 week.   Helpful Tips for Accurate Home Blood Pressure Checks  . Don't smoke, exercise, or drink caffeine 30 minutes before checking your BP . Use the restroom before checking your BP (a full bladder can raise your pressure) . Relax in a comfortable upright chair . Feet on the ground . Left arm resting comfortably on a flat surface at the level of your heart . Legs uncrossed . Back supported . Sit quietly and don't talk . Place the cuff on your bare arm . Adjust snuggly, so that only two fingertips can fit between your skin and the top of the cuff . Check 2 readings separated by at least one minute . Keep a log of your BP readings . For a visual, please reference this diagram: http://ccnc.care/bpdiagram  Provider Name: [facility]     Phone: [phone]  Zone 1: ALL CLEAR  Continue to monitor your symptoms:  . BP reading is less than 140 (top number) or less than 90 (bottom number)  . No right upper stomach pain .  No headaches or seeing spots . No feeling nauseated or throwing up . No swelling in face and hands  Zone 2: CAUTION Call your doctor's office for any of the following:  . BP reading is greater than 140 (top number) or greater than 90 (bottom number)  . Stomach pain under your ribs in the middle or right side . Headaches or seeing spots . Feeling nauseated or throwing up . Swelling in face and hands  Zone 3: EMERGENCY  Seek immediate medical care if you have any of the following:  . BP reading is greater than160 (top number) or greater than 110 (bottom number) . Severe headaches not improving with Tylenol . Serious difficulty catching your breath . Any worsening symptoms from Zone  2    First Trimester of Pregnancy The first trimester of pregnancy is from week 1 until the end of week 12 (months 1 through 3). A week after a sperm fertilizes an egg, the egg will implant on the wall of the uterus. This embryo will begin to develop into a baby. Genes from you and your partner are forming the baby. The female genes determine whether the baby is a boy or a girl. At 6-8 weeks, the eyes and face are formed, and the heartbeat can be seen on ultrasound. At the end of 12 weeks, all the baby's organs are formed.  Now that you are pregnant, you will want to do everything you can to have a healthy baby. Two of the most important things are to get good prenatal care and to follow your health care provider's instructions. Prenatal care is all the medical care you receive before the baby's birth. This care will help prevent, find, and treat any problems during the pregnancy and childbirth. BODY CHANGES Your body goes through many changes during pregnancy. The changes vary from woman to woman.   You may gain or lose a couple of pounds at first.  You may feel sick to your stomach (nauseous) and throw up (vomit). If the vomiting is uncontrollable, call your health care provider.  You may tire easily.  You may develop headaches that can be relieved by medicines approved by your health care provider.  You may urinate more often. Painful urination may mean you have a bladder infection.  You may develop heartburn as a result of your pregnancy.  You may develop constipation because certain hormones are causing the muscles that push waste through your intestines to slow down.  You may develop hemorrhoids or swollen, bulging veins (varicose veins).  Your breasts may begin to grow larger and become tender. Your nipples may stick out more, and the tissue that surrounds them (areola) may become darker.  Your gums may bleed and may be sensitive to brushing and flossing.  Dark spots or blotches  (chloasma, mask of pregnancy) may develop on your face. This will likely fade after the baby is born.  Your menstrual periods will stop.  You may have a loss of appetite.  You may develop cravings for certain kinds of food.  You may have changes in your emotions from day to day, such as being excited to be pregnant or being concerned that something may go wrong with the pregnancy and baby.  You may have more vivid and strange dreams.  You may have changes in your hair. These can include thickening of your hair, rapid growth, and changes in texture. Some women also have hair loss during or after pregnancy, or hair that feels dry  or thin. Your hair will most likely return to normal after your baby is born. WHAT TO EXPECT AT YOUR PRENATAL VISITS During a routine prenatal visit:  You will be weighed to make sure you and the baby are growing normally.  Your blood pressure will be taken.  Your abdomen will be measured to track your baby's growth.  The fetal heartbeat will be listened to starting around week 10 or 12 of your pregnancy.  Test results from any previous visits will be discussed. Your health care provider may ask you:  How you are feeling.  If you are feeling the baby move.  If you have had any abnormal symptoms, such as leaking fluid, bleeding, severe headaches, or abdominal cramping.  If you have any questions. Other tests that may be performed during your first trimester include:  Blood tests to find your blood type and to check for the presence of any previous infections. They will also be used to check for low iron levels (anemia) and Rh antibodies. Later in the pregnancy, blood tests for diabetes will be done along with other tests if problems develop.  Urine tests to check for infections, diabetes, or protein in the urine.  An ultrasound to confirm the proper growth and development of the baby.  An amniocentesis to check for possible genetic problems.  Fetal  screens for spina bifida and Down syndrome.  You may need other tests to make sure you and the baby are doing well. HOME CARE INSTRUCTIONS  Medicines  Follow your health care provider's instructions regarding medicine use. Specific medicines may be either safe or unsafe to take during pregnancy.  Take your prenatal vitamins as directed.  If you develop constipation, try taking a stool softener if your health care provider approves. Diet  Eat regular, well-balanced meals. Choose a variety of foods, such as meat or vegetable-based protein, fish, milk and low-fat dairy products, vegetables, fruits, and whole grain breads and cereals. Your health care provider will help you determine the amount of weight gain that is right for you.  Avoid raw meat and uncooked cheese. These carry germs that can cause birth defects in the baby.  Eating four or five small meals rather than three large meals a day may help relieve nausea and vomiting. If you start to feel nauseous, eating a few soda crackers can be helpful. Drinking liquids between meals instead of during meals also seems to help nausea and vomiting.  If you develop constipation, eat more high-fiber foods, such as fresh vegetables or fruit and whole grains. Drink enough fluids to keep your urine clear or pale yellow. Activity and Exercise  Exercise only as directed by your health care provider. Exercising will help you:  Control your weight.  Stay in shape.  Be prepared for labor and delivery.  Experiencing pain or cramping in the lower abdomen or low back is a good sign that you should stop exercising. Check with your health care provider before continuing normal exercises.  Try to avoid standing for long periods of time. Move your legs often if you must stand in one place for a long time.  Avoid heavy lifting.  Wear low-heeled shoes, and practice good posture.  You may continue to have sex unless your health care provider directs you  otherwise. Relief of Pain or Discomfort  Wear a good support bra for breast tenderness.    Take warm sitz baths to soothe any pain or discomfort caused by hemorrhoids. Use hemorrhoid cream if your  health care provider approves.    Rest with your legs elevated if you have leg cramps or low back pain.  If you develop varicose veins in your legs, wear support hose. Elevate your feet for 15 minutes, 3-4 times a day. Limit salt in your diet. Prenatal Care  Schedule your prenatal visits by the twelfth week of pregnancy. They are usually scheduled monthly at first, then more often in the last 2 months before delivery.  Write down your questions. Take them to your prenatal visits.  Keep all your prenatal visits as directed by your health care provider. Safety  Wear your seat belt at all times when driving.  Make a list of emergency phone numbers, including numbers for family, friends, the hospital, and police and fire departments. General Tips  Ask your health care provider for a referral to a local prenatal education class. Begin classes no later than at the beginning of month 6 of your pregnancy.  Ask for help if you have counseling or nutritional needs during pregnancy. Your health care provider can offer advice or refer you to specialists for help with various needs.  Do not use hot tubs, steam rooms, or saunas.  Do not douche or use tampons or scented sanitary pads.  Do not cross your legs for long periods of time.  Avoid cat litter boxes and soil used by cats. These carry germs that can cause birth defects in the baby and possibly loss of the fetus by miscarriage or stillbirth.  Avoid all smoking, herbs, alcohol, and medicines not prescribed by your health care provider. Chemicals in these affect the formation and growth of the baby.  Schedule a dentist appointment. At home, brush your teeth with a soft toothbrush and be gentle when you floss. SEEK MEDICAL CARE IF:   You have  dizziness.  You have mild pelvic cramps, pelvic pressure, or nagging pain in the abdominal area.  You have persistent nausea, vomiting, or diarrhea.  You have a bad smelling vaginal discharge.  You have pain with urination.  You notice increased swelling in your face, hands, legs, or ankles. SEEK IMMEDIATE MEDICAL CARE IF:   You have a fever.  You are leaking fluid from your vagina.  You have spotting or bleeding from your vagina.  You have severe abdominal cramping or pain.  You have rapid weight gain or loss.  You vomit blood or material that looks like coffee grounds.  You are exposed to [place] measles and have never had them.  You are exposed to fifth disease or chickenpox.  You develop a severe headache.  You have shortness of breath.  You have any kind of trauma, such as from a fall or a car accident. Document Released: 12/16/2000 Document Revised: 05/08/2013 Document Reviewed: 11/01/2012 [facility] Patient Information 2015 [place], [place]. This information is not intended to replace advice given to you by your health care provider. Make sure you discuss any questions you have with your health care provider.  Coronavirus (COVID-19) Are you at risk?  Are you at risk for the Coronavirus (COVID-19)?  To be considered HIGH RISK for Coronavirus (COVID-19), you have to meet the following criteria:  . Traveled to [place], [place], [place], [place] or [place]; or in the [place] to [place], [place], [place], or [place]; and have fever, cough, and shortness of breath within the last 2 weeks of travel OR . Been in close contact with a person diagnosed with COVID-19 within the last 2 weeks and  have fever, cough, and shortness of breath . IF YOU DO NOT MEET THESE CRITERIA, YOU ARE CONSIDERED LOW RISK FOR COVID-19.  What to do if you are HIGH RISK for COVID-19?  Marland Kitchen If you are having a medical emergency, call 911. . Seek medical care right away. Before you go to a  doctor's office, urgent care or emergency department, call ahead and tell them about your recent travel, contact with someone diagnosed with COVID-19, and your symptoms. You should receive instructions from your physician's office regarding next steps of care.  . When you arrive at healthcare provider, tell the healthcare staff immediately you have returned from visiting [place], [place], [place], [place] or [place]; or traveled in the [place] to [place], [place], [place], or [place]; in the last two weeks or you have been in close contact with a person diagnosed with COVID-19 in the last 2 weeks.   . Tell the health care staff about your symptoms: fever, cough and shortness of breath. . After you have been seen by a medical provider, you will be either: o Tested for (COVID-19) and discharged home on quarantine except to seek medical care if symptoms worsen, and asked to  - Stay home and avoid contact with others until you get your results (4-5 days)  - Avoid travel on public transportation if possible (such as bus, train, or airplane) or o Sent to the Emergency Department by EMS for evaluation, COVID-19 testing, and possible admission depending on your condition and test results.  What to do if you are LOW RISK for COVID-19?  Reduce your risk of any infection by using the same precautions used for avoiding the common cold or flu:  Marland Kitchen Wash your hands often with soap and warm water for at least 20 seconds.  If soap and water are not readily available, use an alcohol-based hand sanitizer with at least 60% alcohol.  . If coughing or sneezing, cover your mouth and nose by coughing or sneezing into the elbow areas of your shirt or coat, into a tissue or into your sleeve (not your hands). . Avoid shaking hands with others and consider head nods or verbal greetings only. . Avoid touching your eyes, nose, or mouth with unwashed hands.  . Avoid close contact with people who are sick. . Avoid  places or events with large numbers of people in one location, like concerts or sporting events. . Carefully consider travel plans you have or are making. . If you are planning any travel outside or inside the [place], visit the CDC's Travelers' Health webpage for the latest health notices. . If you have some symptoms but not all symptoms, continue to monitor at home and seek medical attention if your symptoms worsen. . If you are having a medical emergency, call 911.   [place] / e-Visit: [email or web address]         [facility]: [facility]: [id number]                   [facility]: [phone]

## 2018-12-07 NOTE — Progress Notes (Signed)
INITIAL OBSTETRICAL VISIT Patient name: Michelle Larson MRN [id number]  Date of birth: [birth year] Chief Complaint:   Initial Prenatal Visit  History of Present Illness:   Michelle Larson is a 23 y.o. [id number] Hispanic female at [redacted]w[redacted]d by LMP and confirmed with 6wk scan with an Estimated Date of Delivery: 06/01/19 being seen today for her initial obstetrical visit.   Her obstetrical history is significant for low risk vag del x 2 in [place].   Today she reports heartburn and nausea.  Patient's last menstrual period was 08/25/2018 (exact date). Last pap 2019. Results were: normal per pt. Review of Systems:   Pertinent items are noted in HPI Denies cramping/contractions, leakage of fluid, vaginal bleeding, abnormal vaginal discharge w/ itching/odor/irritation, headaches, visual changes, shortness of breath, chest pain, abdominal pain, severe nausea/vomiting, or problems with urination or bowel movements unless otherwise stated above.  Pertinent History Reviewed:  Reviewed past medical,surgical, social, obstetrical and family history.  Reviewed problem list, medications and allergies. OB History  Gravida Para Term Preterm AB Living  4 2 2   1 2   SAB TAB Ectopic Multiple Live Births  1       2    # Outcome Date GA Lbr Len/2nd Weight Sex Delivery Anes PTL Lv  4 Current           3 SAB 06/2018          2 Term 02/18/16 [redacted]w[redacted]d  8 lb 7 oz (3.827 kg) M Vag-Spont  N LIV  1 Term 08/25/12 [redacted]w[redacted]d  8 lb 12 oz (3.969 kg) F Vag-Spont  N LIV   Physical Assessment:   Vitals:   12/07/18 1610  BP: 114/76  Pulse: 82  Weight: 185 lb (83.9 kg)  Body mass index is 33.84 kg/m.       Physical Examination:  General appearance - well appearing, and in no distress  Mental status - alert, oriented to person, place, and time  Psych:  She has a normal mood and affect  Skin - warm and dry, normal color, no suspicious lesions noted  Chest - effort normal, all lung fields clear to auscultation bilaterally   Heart - normal rate and regular rhythm  Abdomen - soft, nontender; FHT 155bpm  Extremities:  No swelling or varicosities noted  Pelvic - VULVA: normal appearing vulva with no masses, tenderness or lesions  VAGINA: normal appearing vagina with normal color and discharge, no lesions  CERVIX: normal appearing cervix without discharge or lesions, no CMT  Thin prep pap is not done  TODAY'S NT - pt was out of town last week and didn't do NT  Results for orders placed or performed in visit on 12/07/18 (from the past 24 hour(s))  POC Urinalysis Dipstick OB   Collection Time: 12/07/18  4:15 PM  Result Value Ref Range   Color, UA     Clarity, UA     Glucose, UA Negative Negative   Bilirubin, UA     Ketones, UA neg    Spec Grav, UA     Blood, UA neg    pH, UA     POC,PROTEIN,UA Negative Negative, Trace, Small (1+), Moderate (2+), Large (3+), 4+   Urobilinogen, UA     Nitrite, UA neg    Leukocytes, UA Negative Negative   Appearance     Odor      Assessment & Plan:  1) Low-Risk Pregnancy [id number] at [redacted]w[redacted]d with an Estimated Date of Delivery: 06/01/19   2) Initial OB visit  3) Nausea, occ vomiting, rx Diclegis and Zofran  Meds:  Meds ordered this encounter  Medications  . Blood Pressure Monitor MISC    Sig: For regular home bp monitoring during pregnancy    Dispense:  1 each    Refill:  0    Z34.90  . Doxylamine-Pyridoxine 10-10 MG TBEC    Sig: Take 2 po q hs and may take 1 in the morning and then 1 in the afternoon    Dispense:  120 tablet    Refill:  3  . ondansetron (ZOFRAN ODT) 4 MG disintegrating tablet    Sig: Take 1 tablet (4 mg total) by mouth every 8 (eight) hours as needed for nausea or vomiting.    Dispense:  30 tablet    Refill:  1    Initial labs obtained Continue prenatal vitamins Reviewed n/v relief measures and warning s/s to report Reviewed recommended weight gain based on pre-gravid BMI Encouraged well-balanced diet Genetic Screening discussed: requested   Cystic fibrosis, SMA, Fragile X screening NOT discussed  Ultrasound discussed; fetal survey: requested CCNC completed>PCM not here, form faxed The nature of [facility] [employer] with multiple MDs and other Advanced Practice Providers was explained to patient; also emphasized that fellows, residents, and students are part of our team. Doesn't have home bp cuff. Rx faxed. Check bp weekly, let us know if >140/90.   Follow-up: Return in about 4 weeks (around 01/04/2019) for LROB, [place]: Anatomy, in person.   Orders Placed This Encounter  Procedures  . Urine Culture  . US OB Comp + 14 Wk  . Flu Vaccine QUAD 36+ mos IM  . Urinalysis, Routine w reflex microscopic  . Sickle Cell Scr  . Pain Management Screening Profile (10S)  . Obstetric Panel, Including HIV  . MaterniT21 PLUS Core  . POC Urinalysis Dipstick OB    Arabella Merles [facility] 12/07/2018 4:32 PM

## 2018-12-08 ENCOUNTER — Ambulatory Visit: Payer: Medicaid Other | Admitting: *Deleted

## 2018-12-08 ENCOUNTER — Other Ambulatory Visit: Payer: Medicaid Other

## 2018-12-08 ENCOUNTER — Encounter: Payer: Medicaid Other | Admitting: Advanced Practice Midwife

## 2018-12-08 LAB — OBSTETRIC PANEL, INCLUDING HIV
Antibody Screen: NEGATIVE
Basophils Absolute: 0 10*3/uL (ref 0.0–0.2)
Basos: 0 %
EOS (ABSOLUTE): 0.2 10*3/uL (ref 0.0–0.4)
Eos: 2 %
HIV Screen 4th Generation wRfx: NONREACTIVE
Hematocrit: 37.8 % (ref 34.0–46.6)
Hemoglobin: 13.1 g/dL (ref 11.1–15.9)
Hepatitis B Surface Ag: NEGATIVE
Immature Grans (Abs): 0.1 10*3/uL (ref 0.0–0.1)
Immature Granulocytes: 1 %
Lymphocytes Absolute: 2 10*3/uL (ref 0.7–3.1)
Lymphs: 21 %
MCH: 32 pg (ref 26.6–33.0)
MCHC: 34.7 g/dL (ref 31.5–35.7)
MCV: 92 fL (ref 79–97)
Monocytes Absolute: 0.7 10*3/uL (ref 0.1–0.9)
Monocytes: 7 %
Neutrophils Absolute: 6.5 10*3/uL (ref 1.4–7.0)
Neutrophils: 69 %
Platelets: 215 10*3/uL (ref 150–450)
RBC: 4.09 x10E6/uL (ref 3.77–5.28)
RDW: 12.7 % (ref 11.7–15.4)
RPR Ser Ql: NONREACTIVE
Rh Factor: POSITIVE
Rubella Antibodies, IGG: 1.49 index (ref >0.99)
WBC: 9.4 10*3/uL (ref 3.4–10.8)

## 2018-12-08 LAB — PMP SCREEN PROFILE (10S), URINE
Amphetamine Scrn, Ur: NEGATIVE ng/mL
BARBITURATE SCREEN URINE: NEGATIVE ng/mL
BENZODIAZEPINE SCREEN, URINE: NEGATIVE ng/mL
CANNABINOIDS UR QL SCN: NEGATIVE ng/mL
Cocaine (Metab) Scrn, Ur: NEGATIVE ng/mL
Creatinine(Crt), U: 92.6 mg/dL (ref 20.0–300.0)
Methadone Screen, Urine: NEGATIVE ng/mL
OXYCODONE+OXYMORPHONE UR QL SCN: NEGATIVE ng/mL
Opiate Scrn, Ur: NEGATIVE ng/mL
Ph of Urine: 6.7 (ref 4.5–8.9)
Phencyclidine Qn, Ur: NEGATIVE ng/mL
Propoxyphene Scrn, Ur: NEGATIVE ng/mL

## 2018-12-08 LAB — URINALYSIS, ROUTINE W REFLEX MICROSCOPIC
Bilirubin, UA: NEGATIVE
Glucose, UA: NEGATIVE
Ketones, UA: NEGATIVE
Nitrite, UA: NEGATIVE
Specific Gravity, UA: 1.026 (ref 1.005–1.030)
Urobilinogen, Ur: 0.2 mg/dL (ref 0.2–1.0)
pH, UA: 7 (ref 5.0–7.5)

## 2018-12-08 LAB — MICROSCOPIC EXAMINATION
Casts: NONE SEEN /lpf
Epithelial Cells (non renal): >10 /hpf — AB (ref 0–10)

## 2018-12-08 LAB — SICKLE CELL SCREEN: Sickle Cell Screen: NEGATIVE

## 2018-12-09 LAB — URINE CULTURE

## 2018-12-13 LAB — MATERNIT 21 PLUS CORE, BLOOD
Fetal Fraction: 8
Result (T21): NEGATIVE
Trisomy 13 (Patau syndrome): NEGATIVE
Trisomy 18 (Edwards syndrome): NEGATIVE
Trisomy 21 (Down syndrome): NEGATIVE

## 2018-12-16 ENCOUNTER — Ambulatory Visit: Payer: Medicaid Other | Admitting: *Deleted

## 2018-12-22 DIAGNOSIS — Z3490 Encounter for supervision of normal pregnancy, unspecified, unspecified trimester: Secondary | ICD-10-CM | POA: Diagnosis not present

## 2019-01-03 ENCOUNTER — Other Ambulatory Visit: Payer: Self-pay | Admitting: Advanced Practice Midwife

## 2019-01-03 DIAGNOSIS — Z3482 Encounter for supervision of other normal pregnancy, second trimester: Secondary | ICD-10-CM

## 2019-01-03 DIAGNOSIS — Z363 Encounter for antenatal screening for malformations: Secondary | ICD-10-CM

## 2019-01-04 ENCOUNTER — Ambulatory Visit (INDEPENDENT_AMBULATORY_CARE_PROVIDER_SITE_OTHER): Payer: Medicaid Other

## 2019-01-04 ENCOUNTER — Encounter: Payer: Self-pay | Admitting: Obstetrics and Gynecology

## 2019-01-04 ENCOUNTER — Other Ambulatory Visit: Payer: Self-pay

## 2019-01-04 ENCOUNTER — Ambulatory Visit (INDEPENDENT_AMBULATORY_CARE_PROVIDER_SITE_OTHER): Payer: Medicaid Other | Admitting: Obstetrics and Gynecology

## 2019-01-04 VITALS — BP 124/69 | HR 100 | Wt 197.6 lb

## 2019-01-04 DIAGNOSIS — Z3482 Encounter for supervision of other normal pregnancy, second trimester: Secondary | ICD-10-CM

## 2019-01-04 DIAGNOSIS — Z3A18 18 weeks gestation of pregnancy: Secondary | ICD-10-CM

## 2019-01-04 DIAGNOSIS — Z1389 Encounter for screening for other disorder: Secondary | ICD-10-CM

## 2019-01-04 DIAGNOSIS — Z331 Pregnant state, incidental: Secondary | ICD-10-CM

## 2019-01-04 DIAGNOSIS — Z363 Encounter for antenatal screening for malformations: Secondary | ICD-10-CM

## 2019-01-04 LAB — POCT URINALYSIS DIPSTICK OB
Blood, UA: NEGATIVE
Glucose, UA: NEGATIVE
Ketones, UA: NEGATIVE
Nitrite, UA: NEGATIVE
POC,PROTEIN,UA: NEGATIVE

## 2019-01-04 MED ORDER — BLOOD PRESSURE CUFF MISC: 1.0000 | 0 refills | Status: DC

## 2019-01-04 NOTE — Progress Notes (Signed)
Patient ID: Michelle Larson, female   DOB: 1995/06/27, 23 y.o.   MRN: [id number]   LOW-RISK PREGNANCY VISIT Patient name: Michelle Larson MRN [id number]  Date of birth: 1995-07-16 Chief Complaint:   Routine Prenatal Visit ([place] today)  History of Present Illness:   Michelle Larson is a 23 y.o. [phone] female at [redacted]w[redacted]d with an Estimated Date of Delivery: 06/01/19 being seen today for ongoing management of a low-risk pregnancy.  Today she reports no complaints. The pt does not have a BP cuff at home. Ordered one today   Contractions: Not present. Vag. Bleeding: None.  Movement: Present. denies leaking of fluid. Review of Systems:   Pertinent items are noted in HPI Denies abnormal vaginal discharge w/ itching/odor/irritation, headaches, visual changes, shortness of breath, chest pain, abdominal pain, severe nausea/vomiting, or problems with urination or bowel movements unless otherwise stated above. Pertinent History Reviewed:  Reviewed past medical,surgical, social, obstetrical and family history.  Reviewed problem list, medications and allergies. Physical Assessment:   Vitals:   01/04/19 1413  BP: 124/69  Pulse: 100  Weight: 197 lb 9.6 oz (89.6 kg)  Body mass index is 36.14 kg/m.        Physical Examination:   General appearance: Well appearing, and in no distress  Mental status: Alert, oriented to person, place, and time  Skin: Warm & dry  Cardiovascular: Normal heart rate noted  Respiratory: Normal respiratory effort, no distress  Abdomen: Soft, gravid, nontender  Pelvic: Cervical exam deferred         Extremities: Edema: None  Fetal Status:     Movement: Present    Results for orders placed or performed in visit on 01/04/19 (from the past 24 hour(s))  POC Urinalysis Dipstick OB   Collection Time: 01/04/19  2:14 PM  Result Value Ref Range   Color, UA     Clarity, UA     Glucose, UA Negative Negative   Bilirubin, UA     Ketones, UA neg    Spec Grav, UA     Blood, UA neg    pH, UA     POC,PROTEIN,UA Negative Negative, Trace, Small (1+), Moderate (2+), Large (3+), 4+   Urobilinogen, UA     Nitrite, UA neg    Leukocytes, UA Moderate (2+) (A) Negative   Appearance     Odor      Assessment & Plan:  1) Low-risk pregnancy [id number] at [redacted]w[redacted]d with an Estimated Date of Delivery: 06/01/19    Plan:   1. Continue routine obstetrical care 2. Order BP cuff  Meds: No orders of the defined types were placed in this encounter.  Labs/procedures today: [place] 18+6 wks,transverse head right,posterior placenta gr 0,cx 3.7 cm,normal ovaries,svp of fluid 5.2 cm,fhr 146 bpm,efw 279 g 64%,anatomy complete,no obvious abnormalities    Follow-up: Return in about 4 weeks (around 02/01/2019) for LROB. by phone  Orders Placed This Encounter  Procedures  . POC Urinalysis Dipstick OB   By signing my name below, I, De Burrs, attest that this documentation has been prepared under the direction and in the presence of Jonnie Kind, MD. Electronically Signed: De Burrs, Medical Scribe. 01/04/19. 2:33 PM.  I personally performed the services described in this documentation, which was SCRIBED in my presence. The recorded information has been reviewed and considered accurate. It has been edited as necessary during review. Jonnie Kind, MD

## 2019-01-04 NOTE — Progress Notes (Signed)
[place] 18+6 wks,transverse head right,posterior placenta gr 0,cx 3.7 cm,normal ovaries,svp of fluid 5.2 cm,fhr 146 bpm,efw 279 g 64%,anatomy complete,no obvious abnormalities

## 2019-01-04 NOTE — Patient Instructions (Addendum)
CHILDBIRTH CLASSES ° °[facility] °Call to Register: [phone] or [phone] or Register Online: [email or web address] ° °THESE CLASSES FILL UP VERY QUICKLY, SO SIGN UP AS SOON AS YOU CAN!!! ° °*Please visit [facility] pregnancy website at [email or web address]* ° °Option 1: Birth & Baby Series °• This series of 3 weekly classes helps you and your labor partner prepare for childbirth at [facility]. °• Reviews newborn care, labor & birth, pain management, and comfort techniques °• Maternity Care Center Tour of [facility] is included. °• Cost: $60 per couple for insured or self-pay, $30 per couple for Medicaid ° °Option 2: Weekend Birth & Baby °• This class is a weekend version of our Birth & Baby series. It is designed for parents who have a difficult time fitting several weeks of classes into their schedule.  °• Maternity Care Center Tour of [facility] is included.  °• Friday 6:30pm-8:30pm, Saturday 9am-4pm °• Cost: $75 per couple for insured or self-pay, $30 per couple for Medicaid ° °Option 3: Natural Childbirth °• This series of 5 weekly classes is for expectant parents who want to learn and practice natural methods of coping with the process of labor and childbirth. °• Maternity Care Center Tour of [facility] is included. °• Cost: $75 per couple for insured or self-pay, $30 per couple for Medicaid ° °Option 4: Online Birth & Baby °• This online class offers you the freedom to complete a Birth & Baby series in the comfort of your own home. The flexibility of this option allows you to review sections at your own place, at times convenient to you and your support people.  °• Cost: $60 for 60 days of online access ° ° ° °Other Available Classes °Baby & Me °Enjoy this time to discuss newborn & infant parenting topics and family adjustment issues with other new mothers in a relaxed environment. Each week brings a new speaker or baby-centered activity. We encourage  mothers and their babies (birth to crawling) to join us every Thursday in the [facility] at 11:00 am. You are welcome to visit this group even if you haven't delivered yet! It's wonderful to make new friends early and watch other moms interact with their babies. No registration or fee.  ° °Big Brother/ Big Sister °Let your children share in the joy of a new brother or sister in this special class designed just for them. This class is designed for children ages 2-6, but any age is welcome. Please register each child individually. ° °Breastfeeding Support Group °This group is a mother-to-mother support circle where moms have the opportunity to share their breastfeeding experiences. A breastfeeding Support nurse is present for questions and concerns. Meets each Tuesday at 11:00 am. No fee or registration. ° °Breastfeeding Your Baby °Breastfeeding is a special time for mother and child. This class will help you feel ready to begin this important relationship. Your partner is encouraged to attend with you.  ° °Caring For Baby °This class is for expectant  and adoptive parents who want to learn and practice the most up-to-date newborn care for their babies. Register only the mom-to-be and your partner can come with you. (Note: This class is included in the Birth & Baby series and the Weekend Birth & Baby classes.) ° °Comfort Techniques & Tour °This 2-hour interactive class will provide you the opportunity to learn & practice hands-on techniques with your partner that can help relieve some discomfort of labor and encourage your baby to rotate toward   the best position for birth. A tour of the [facility] is included.  ° °Daddy Boot Camp °This course offers Dads-to-be the tools and knowledge needed to feel confident on their journey to becoming new fathers. ° °Grandparent Love °Expecting a grandbaby? Learn about the latest infant care and safety recommendation and ways to  support your own child as he or she transitions into the parenting role.  ° °Infant and Child CPR °Parents, grandparents, babysitters, and friends learn Cardio-Pulmonary Resuscitation skills for infants and children. Register each participant individually. (Note: This Family & Friends program does not offer certification.) ° °Marvelous Multiples °Expecting twins, triplets, or more? This class covers the differences in labor, birth, parenting, and breastfeeding issues that face multiples' parents. NICU tour is included.  ° °Mom Talk °This mom-led group offers support and connection to mothers as they journey through the adjustments and struggles of that sometimes overwhelming first year after the birth of a child. A member of our [facility] staff will be present to share resources and additional support if needed, as you care for yourself and baby. You are welcome to visit the group before you deliver! It's wonderful to meet new friends early and watch other moms interact with their babies. It's held at [facility] at 10:00 am each Tuesday morning and 6:00 pm each Thursday evening. Babies (birth to crawling) welcome. No registration or fee.  ° °Waterbirth Classes °Interested in a waterbirth? This informational class will help you discover whether waterbirth is the right fir for you.  ° °[facility] Virtual Maternity Tour °View a virtual tour of [facility]. In-person tours are available for participants of childbirth education classes.  ° °

## 2019-01-06 NOTE — L&D Delivery Note (Signed)
  OB/GYN Faculty Practice Delivery Note  Michelle Larson is a 24 y.o. [phone] s/p NSVD Waterbirth at [redacted]w[redacted]d. She was admitted for active labor.   ROM: 0h 65m with clear fluid GBS Status: negative Maximum Maternal Temperature: 99.0  Labor Progress: . Admitted for active labor. Patient labored in waterbirth tub, and then requested AROM. After AROM patient had rapid progress to NSVD in the tub.   Delivery Date/Time: 05/31/2019 at -132  Delivery:  Head delivered OA. No nuchal cord present. Shoulder and body delivered in usual fashion. Infant with spontaneous cry, placed on mother's abdomen, dried and stimulated. Cord clamped x 2 after 1-minute delay, and cut by FOB. Cord blood drawn. Placenta delivered spontaneously with gentle cord traction. Labia, perineum, vagina, and cervix inspected inspected with no laceration. Initially fundus was not firm and large amount of brisk bright red vaginal bleeding was noted. IM pit was administered as patient did not have IV. Patient give buccal cytotec, IV started and IV pitocin bolus given. Bleeding slowed to normal, and fundus was firm.   Placenta: spontaneous/complete/intact Complications: PPD Lacerations: none EBL: 754 cc Analgesia: waterbirth tub   Postpartum Planning [x]  message to sent to schedule follow-up     Infant: Female  APGARs 9/9  pending  11/9 DNP, CNM  05/31/19  2:10 AM

## 2019-02-01 ENCOUNTER — Other Ambulatory Visit: Payer: Self-pay

## 2019-02-01 ENCOUNTER — Telehealth: Payer: Medicaid Other | Admitting: Obstetrics and Gynecology

## 2019-02-02 ENCOUNTER — Ambulatory Visit (INDEPENDENT_AMBULATORY_CARE_PROVIDER_SITE_OTHER): Payer: Medicaid Other | Admitting: Advanced Practice Midwife

## 2019-02-02 ENCOUNTER — Other Ambulatory Visit: Payer: Self-pay

## 2019-02-02 VITALS — BP 119/69 | HR 95 | Wt 197.0 lb

## 2019-02-02 DIAGNOSIS — Z1389 Encounter for screening for other disorder: Secondary | ICD-10-CM

## 2019-02-02 DIAGNOSIS — Z3482 Encounter for supervision of other normal pregnancy, second trimester: Secondary | ICD-10-CM

## 2019-02-02 DIAGNOSIS — Z331 Pregnant state, incidental: Secondary | ICD-10-CM

## 2019-02-02 DIAGNOSIS — Z3A23 23 weeks gestation of pregnancy: Secondary | ICD-10-CM

## 2019-02-02 LAB — POCT URINALYSIS DIPSTICK OB
Blood, UA: NEGATIVE
Glucose, UA: NEGATIVE
Ketones, UA: NEGATIVE
Nitrite, UA: NEGATIVE
POC,PROTEIN,UA: NEGATIVE

## 2019-02-02 NOTE — Patient Instructions (Signed)

## 2019-02-02 NOTE — Progress Notes (Signed)
   LOW-RISK PREGNANCY VISIT Patient name: Michelle Larson MRN [id number]  Date of birth: 1995/01/30 Chief Complaint:   Routine Prenatal Visit  History of Present Illness:   Michelle Larson is a 24 y.o. [phone] female at [redacted]w[redacted]d with an Estimated Date of Delivery: 06/01/19 being seen today for ongoing management of a low-risk pregnancy.  Today she reports no complaints. Contractions: Not present. Vag. Bleeding: None.  Movement: Present. denies leaking of fluid. Review of Systems:   Pertinent items are noted in HPI Denies abnormal vaginal discharge w/ itching/odor/irritation, headaches, visual changes, shortness of breath, chest pain, abdominal pain, severe nausea/vomiting, or problems with urination or bowel movements unless otherwise stated above. Pertinent History Reviewed:  Reviewed past medical,surgical, social, obstetrical and family history.  Reviewed problem list, medications and allergies. Physical Assessment:   Vitals:   02/02/19 1611  BP: 119/69  Pulse: 95  Weight: 197 lb (89.4 kg)  Body mass index is 36.03 kg/m.        Physical Examination:   General appearance: Well appearing, and in no distress  Mental status: Alert, oriented to person, place, and time  Skin: Warm & dry  Cardiovascular: Normal heart rate noted  Respiratory: Normal respiratory effort, no distress  Abdomen: Soft, gravid, nontender  Pelvic: Cervical exam deferred         Extremities: Edema: None  Fetal Status: Fetal Heart Rate (bpm): 150 Fundal Height: 23 cm Movement: Present    Chaperone: n/a    Results for orders placed or performed in visit on 02/02/19 (from the past 24 hour(s))  POC Urinalysis Dipstick OB   Collection Time: 02/02/19  4:18 PM  Result Value Ref Range   Color, UA     Clarity, UA     Glucose, UA Negative Negative   Bilirubin, UA     Ketones, UA neg    Spec Grav, UA     Blood, UA neg    pH, UA     POC,PROTEIN,UA Negative Negative, Trace, Small (1+), Moderate (2+), Large  (3+), 4+   Urobilinogen, UA     Nitrite, UA neg    Leukocytes, UA Small (1+) (A) Negative   Appearance     Odor      Assessment & Plan:  1) Low-risk pregnancy [id number] at [redacted]w[redacted]d with an Estimated Date of Delivery: 06/01/19     Meds: No orders of the defined types were placed in this encounter.  Labs/procedures today:   Plan:  Continue routine obstetrical care  Next visit: prefers will be in person for GTT    Reviewed: Preterm labor symptoms and general obstetric precautions including but not limited to vaginal bleeding, contractions, leaking of fluid and fetal movement were reviewed in detail with the patient.  All questions were answered. hasnt gotten her home bp cuff.will check on it  Follow-up: Return in about 4 weeks (around 03/02/2019) for PN2/LROB.  Orders Placed This Encounter  Procedures  . POC Urinalysis Dipstick OB   Jacklyn Shell DNP, CNM 02/02/2019 4:49 PM

## 2019-02-03 DIAGNOSIS — Z3490 Encounter for supervision of normal pregnancy, unspecified, unspecified trimester: Secondary | ICD-10-CM | POA: Diagnosis not present

## 2019-03-02 ENCOUNTER — Encounter: Payer: Medicaid Other | Admitting: Advanced Practice Midwife

## 2019-03-02 ENCOUNTER — Other Ambulatory Visit: Payer: Medicaid Other

## 2019-03-03 ENCOUNTER — Other Ambulatory Visit: Payer: Medicaid Other

## 2019-03-03 ENCOUNTER — Ambulatory Visit (INDEPENDENT_AMBULATORY_CARE_PROVIDER_SITE_OTHER): Payer: Medicaid Other | Admitting: Advanced Practice Midwife

## 2019-03-03 ENCOUNTER — Other Ambulatory Visit: Payer: Self-pay

## 2019-03-03 VITALS — BP 113/64 | HR 85 | Wt 202.0 lb

## 2019-03-03 DIAGNOSIS — Z23 Encounter for immunization: Secondary | ICD-10-CM | POA: Diagnosis not present

## 2019-03-03 DIAGNOSIS — Z3482 Encounter for supervision of other normal pregnancy, second trimester: Secondary | ICD-10-CM

## 2019-03-03 DIAGNOSIS — Z331 Pregnant state, incidental: Secondary | ICD-10-CM

## 2019-03-03 DIAGNOSIS — Z1389 Encounter for screening for other disorder: Secondary | ICD-10-CM | POA: Diagnosis not present

## 2019-03-03 DIAGNOSIS — Z3A27 27 weeks gestation of pregnancy: Secondary | ICD-10-CM

## 2019-03-03 LAB — POCT URINALYSIS DIPSTICK OB
Blood, UA: NEGATIVE
Glucose, UA: NEGATIVE
Ketones, UA: NEGATIVE
Leukocytes, UA: NEGATIVE
Nitrite, UA: NEGATIVE
POC,PROTEIN,UA: NEGATIVE

## 2019-03-03 NOTE — Patient Instructions (Signed)
[facility], I greatly value your feedback.  If you receive a survey following your visit with [place] today, we appreciate you taking the time to fill it out.  Thanks, Philipp Deputy CNM  [facility] HAS MOVED!!! It is now [facility] ([address] [place], [place] [address]) Entrance located off of [address] Free 24/7 valet parking   Go to [email or web address] to register for FREE online childbirth classes  [place] Pediatricians/Family Doctors:  [facility] [phone]            [facility] [phone]                 [facility] [phone] (usually not accepting new patients unless you have family there already, you are always welcome to call and ask)       [facility] Department [phone]       [facility] Pediatricians/Family Doctors:   [facility]: [phone]  [facility]: [phone]  [facility]: [phone]  [facility] Doctors:   [facility]: [phone]   [facility]: [phone]  [facility] Doctors:  [facility]: [phone]    Home Blood Pressure Monitoring for Patients   Your provider has recommended that you check your blood pressure (BP) at least once a week at home. If you do not have a blood pressure cuff at home, one will be provided for you. Contact your provider if you have not received your monitor within 1 week.   Helpful Tips for Accurate Home Blood Pressure Checks  . Don't smoke, exercise, or drink caffeine 30 minutes before checking your BP . Use the restroom before checking your BP (a full bladder can raise your pressure) . Relax in a comfortable upright chair . Feet on the ground . Left arm resting comfortably on a flat surface at the level of your heart . Legs uncrossed . Back supported . Sit quietly and don't talk . Place the cuff on your  bare arm . Adjust snuggly, so that only two fingertips can fit between your skin and the top of the cuff . Check 2 readings separated by at least one minute . Keep a log of your BP readings . For a visual, please reference this diagram: http://ccnc.care/bpdiagram  Provider Name: [facility]     Phone: [phone]  Zone 1: ALL CLEAR  Continue to monitor your symptoms:  . BP reading is less than 140 (top number) or less than 90 (bottom number)  . No right upper stomach pain . No headaches or seeing spots . No feeling nauseated or throwing up . No swelling in face and hands  Zone 2: CAUTION Call your doctor's office for any of the following:  . BP reading is greater than 140 (top number) or greater than 90 (bottom number)  . Stomach pain under your ribs in the middle or right side . Headaches or seeing spots . Feeling nauseated or throwing up . Swelling in face and hands  Zone 3: EMERGENCY  Seek immediate medical care if you have any of the following:  . BP reading is greater than160 (top number) or greater than 110 (bottom number) . Severe headaches not improving with Tylenol . Serious difficulty catching your breath . Any worsening symptoms from Zone 2     Second Trimester of Pregnancy The second trimester is from week 14 through week 27 (months 4 through 6). The second trimester is often a  time when you feel your best. Your body has adjusted to being pregnant, and you begin to feel better physically. Usually, morning sickness has lessened or quit completely, you may have more energy, and you may have an increase in appetite. The second trimester is also a time when the fetus is growing rapidly. At the end of the sixth month, the fetus is about 9 inches long and weighs about 1 pounds. You will likely begin to feel the baby move (quickening) between 16 and 20 weeks of pregnancy. Body changes during your second trimester Your body continues to go through many changes during  your second trimester. The changes vary from woman to woman.  Your weight will continue to increase. You will notice your lower abdomen bulging out.  You may begin to get stretch marks on your hips, abdomen, and breasts.  You may develop headaches that can be relieved by medicines. The medicines should be approved by your health care provider.  You may urinate more often because the fetus is pressing on your bladder.  You may develop or continue to have heartburn as a result of your pregnancy.  You may develop constipation because certain hormones are causing the muscles that push waste through your intestines to slow down.  You may develop hemorrhoids or swollen, bulging veins (varicose veins).  You may have back pain. This is caused by: ? Weight gain. ? Pregnancy hormones that are relaxing the joints in your pelvis. ? A shift in weight and the muscles that support your balance.  Your breasts will continue to grow and they will continue to become tender.  Your gums may bleed and may be sensitive to brushing and flossing.  Dark spots or blotches (chloasma, mask of pregnancy) may develop on your face. This will likely fade after the baby is born.  A dark line from your belly button to the pubic area (linea nigra) may appear. This will likely fade after the baby is born.  You may have changes in your hair. These can include thickening of your hair, rapid growth, and changes in texture. Some women also have hair loss during or after pregnancy, or hair that feels dry or thin. Your hair will most likely return to normal after your baby is born.  What to expect at prenatal visits During a routine prenatal visit:  You will be weighed to make sure you and the fetus are growing normally.  Your blood pressure will be taken.  Your abdomen will be measured to track your baby's growth.  The fetal heartbeat will be listened to.  Any test results from the previous visit will be  discussed.  Your health care provider may ask you:  How you are feeling.  If you are feeling the baby move.  If you have had any abnormal symptoms, such as leaking fluid, bleeding, severe headaches, or abdominal cramping.  If you are using any tobacco products, including cigarettes, chewing tobacco, and electronic cigarettes.  If you have any questions.  Other tests that may be performed during your second trimester include:  Blood tests that check for: ? Low iron levels (anemia). ? High blood sugar that affects pregnant women (gestational diabetes) between 69 and 28 weeks. ? Rh antibodies. This is to check for a protein on red blood cells (Rh factor).  Urine tests to check for infections, diabetes, or protein in the urine.  An ultrasound to confirm the proper growth and development of the baby.  An amniocentesis to check for  possible genetic problems.  Fetal screens for spina bifida and Down syndrome.  HIV (human immunodeficiency virus) testing. Routine prenatal testing includes screening for HIV, unless you choose not to have this test.  Follow these instructions at home: Medicines  Follow your health care provider's instructions regarding medicine use. Specific medicines may be either safe or unsafe to take during pregnancy.  Take a prenatal vitamin that contains at least 600 micrograms (mcg) of folic acid.  If you develop constipation, try taking a stool softener if your health care provider approves. Eating and drinking  Eat a balanced diet that includes fresh fruits and vegetables, whole grains, good sources of protein such as meat, eggs, or tofu, and low-fat dairy. Your health care provider will help you determine the amount of weight gain that is right for you.  Avoid raw meat and uncooked cheese. These carry germs that can cause birth defects in the baby.  If you have low calcium intake from food, talk to your health care provider about whether you should take a  daily calcium supplement.  Limit foods that are high in fat and processed sugars, such as fried and sweet foods.  To prevent constipation: ? Drink enough fluid to keep your urine clear or pale yellow. ? Eat foods that are high in fiber, such as fresh fruits and vegetables, whole grains, and beans. Activity  Exercise only as directed by your health care provider. Most women can continue their usual exercise routine during pregnancy. Try to exercise for 30 minutes at least 5 days a week. Stop exercising if you experience uterine contractions.  Avoid heavy lifting, wear low heel shoes, and practice good posture.  A sexual relationship may be continued unless your health care provider directs you otherwise. Relieving pain and discomfort  Wear a good support bra to prevent discomfort from breast tenderness.  Take warm sitz baths to soothe any pain or discomfort caused by hemorrhoids. Use hemorrhoid cream if your health care provider approves.  Rest with your legs elevated if you have leg cramps or low back pain.  If you develop varicose veins, wear support hose. Elevate your feet for 15 minutes, 3-4 times a day. Limit salt in your diet. Prenatal Care  Write down your questions. Take them to your prenatal visits.  Keep all your prenatal visits as told by your health care provider. This is important. Safety  Wear your seat belt at all times when driving.  Make a list of emergency phone numbers, including numbers for family, friends, the hospital, and police and fire departments. General instructions  Ask your health care provider for a referral to a local prenatal education class. Begin classes no later than the beginning of month 6 of your pregnancy.  Ask for help if you have counseling or nutritional needs during pregnancy. Your health care provider can offer advice or refer you to specialists for help with various needs.  Do not use hot tubs, steam rooms, or saunas.  Do not  douche or use tampons or scented sanitary pads.  Do not cross your legs for long periods of time.  Avoid cat litter boxes and soil used by cats. These carry germs that can cause birth defects in the baby and possibly loss of the fetus by miscarriage or stillbirth.  Avoid all smoking, herbs, alcohol, and unprescribed drugs. Chemicals in these products can affect the formation and growth of the baby.  Do not use any products that contain nicotine or tobacco, such as cigarettes and  e-cigarettes. If you need help quitting, ask your health care provider.  Visit your dentist if you have not gone yet during your pregnancy. Use a soft toothbrush to brush your teeth and be gentle when you floss. Contact a health care provider if:  You have dizziness.  You have mild pelvic cramps, pelvic pressure, or nagging pain in the abdominal area.  You have persistent nausea, vomiting, or diarrhea.  You have a bad smelling vaginal discharge.  You have pain when you urinate. Get help right away if:  You have a fever.  You are leaking fluid from your vagina.  You have spotting or bleeding from your vagina.  You have severe abdominal cramping or pain.  You have rapid weight gain or weight loss.  You have shortness of breath with chest pain.  You notice sudden or extreme swelling of your face, hands, ankles, feet, or legs.  You have not felt your baby move in over an hour.  You have severe headaches that do not go away when you take medicine.  You have vision changes. Summary  The second trimester is from week 14 through week 27 (months 4 through 6). It is also a time when the fetus is growing rapidly.  Your body goes through many changes during pregnancy. The changes vary from woman to woman.  Avoid all smoking, herbs, alcohol, and unprescribed drugs. These chemicals affect the formation and growth your baby.  Do not use any tobacco products, such as cigarettes, chewing tobacco, and  e-cigarettes. If you need help quitting, ask your health care provider.  Contact your health care provider if you have any questions. Keep all prenatal visits as told by your health care provider. This is important. This information is not intended to replace advice given to you by your health care provider. Make sure you discuss any questions you have with your health care provider. Document Released: 12/16/2000 Document Revised: 05/30/2015 Document Reviewed: 02/23/2012 Elsevier Interactive Patient Education  2017 [place] FLU! Because you are pregnant, we at [facility], along with the Centers for Disease Control (CDC), recommend that you receive the flu vaccine to protect yourself and your baby from the flu. The flu is more likely to cause severe illness in pregnant women than in women of reproductive age who are not pregnant. Changes in the immune system, heart, and lungs during pregnancy make pregnant women (and women up to two weeks postpartum) more prone to severe illness from flu, including illness resulting in hospitalization. Flu also may be harmful for a pregnant woman's developing baby. A common flu symptom is fever, which may be associated with neural tube defects and other adverse outcomes for a developing baby. Getting vaccinated can also help protect a baby after birth from flu. (Mom passes antibodies onto the developing baby during her pregnancy.)  A Flu Vaccine is the Best Protection Against Flu Getting a flu vaccine is the first and most important step in protecting against flu. Pregnant women should get a flu shot and not the live attenuated influenza vaccine (LAIV), also known as nasal spray flu vaccine. Flu vaccines given during pregnancy help protect both the mother and her baby from flu. Vaccination has been shown to reduce the risk of flu-associated acute respiratory infection in pregnant women by up to one-half. A 2018 study showed  that getting a flu shot reduced a pregnant woman's risk of being hospitalized with flu by an average of 40 percent.  Pregnant women who get a flu vaccine are also helping to protect their babies from flu illness for the first several months after their birth, when they are too young to get vaccinated.   A Long Record of Safety for Flu Shots in Pregnant Women Flu shots have been given to millions of pregnant women over many years with a good safety record. There is a lot of evidence that flu vaccines can be given safely during pregnancy; though these data are limited for the first trimester. The CDC recommends that pregnant women get vaccinated during any trimester of their pregnancy. It is very important for pregnant women to get the flu shot.   Other Preventive Actions In addition to getting a flu shot, pregnant women should take the same everyday preventive actions the CDC recommends of everyone, including covering coughs, washing hands often, and avoiding people who are sick.  Symptoms and Treatment If you get sick with flu symptoms call your doctor right away. There are antiviral drugs that can treat flu illness and prevent serious flu complications. The CDC recommends prompt treatment for people who have influenza infection or suspected influenza infection and who are at high risk of serious flu complications, such as people with asthma, diabetes (including gestational diabetes), or heart disease. Early treatment of influenza in hospitalized pregnant women has been shown to reduce the length of the hospital stay.  Symptoms Flu symptoms include fever, cough, sore throat, runny or stuffy nose, body aches, headache, chills and fatigue. Some people may also have vomiting and diarrhea. People may be infected with the flu and have respiratory symptoms without a fever.  Early Treatment is Important for Pregnant Women Treatment should begin as soon as possible because antiviral drugs work best when  started early (within 48 hours after symptoms start). Antiviral drugs can make your flu illness milder and make you feel better faster. They may also prevent serious health problems that can result from flu illness. Oral oseltamivir (Tamiflu) is the preferred treatment for pregnant women because it has the most studies available to suggest that it is safe and beneficial. Antiviral drugs require a prescription from your provider. Having a fever caused by flu infection or other infections early in pregnancy may be linked to birth defects in a baby. In addition to taking antiviral drugs, pregnant women who get a fever should treat their fever with Tylenol (acetaminophen) and contact their provider immediately.  When to [place] If you are pregnant and have any of these signs, seek care immediately:  Difficulty breathing or shortness of breath  Pain or pressure in the chest or abdomen  Sudden dizziness  Confusion  Severe or persistent vomiting  High fever that is not responding to Tylenol (or store brand equivalent)  Decreased or no movement of your baby  [email or web address]

## 2019-03-03 NOTE — Progress Notes (Signed)
   LOW-RISK PREGNANCY VISIT Patient name: Michelle Larson MRN [id number]  Date of birth: 1995-12-17 Chief Complaint:   Routine Prenatal Visit (PN2)  History of Present Illness:   Michelle Larson is a 24 y.o. [id number] female at [redacted]w[redacted]d with an Estimated Date of Delivery: 06/01/19 being seen today for ongoing management of a low-risk pregnancy.  Today she reports no complaints. Contractions: Not present. Vag. Bleeding: None.  Movement: Present. denies leaking of fluid. Review of Systems:   Pertinent items are noted in HPI Denies abnormal vaginal discharge w/ itching/odor/irritation, headaches, visual changes, shortness of breath, chest pain, abdominal pain, severe nausea/vomiting, or problems with urination or bowel movements unless otherwise stated above. Pertinent History Reviewed:  Reviewed past medical,surgical, social, obstetrical and family history.  Reviewed problem list, medications and allergies. Physical Assessment:   Vitals:   03/03/19 0849  BP: 113/64  Pulse: 85  Weight: 202 lb (91.6 kg)  Body mass index is 36.95 kg/m.        Physical Examination:   General appearance: Well appearing, and in no distress  Mental status: Alert, oriented to person, place, and time  Skin: Warm & dry  Cardiovascular: Normal heart rate noted  Respiratory: Normal respiratory effort, no distress  Abdomen: Soft, gravid, nontender  Pelvic: Cervical exam deferred         Extremities: Edema: None  Fetal Status: Fetal Heart Rate (bpm): 140 Fundal Height: 27 cm Movement: Present    Results for orders placed or performed in visit on 03/03/19 (from the past 24 hour(s))  POC Urinalysis Dipstick OB   Collection Time: 03/03/19  8:55 AM  Result Value Ref Range   Color, UA     Clarity, UA     Glucose, UA Negative Negative   Bilirubin, UA     Ketones, UA neg    Spec Grav, UA     Blood, UA neg    pH, UA     POC,PROTEIN,UA Negative Negative, Trace, Small (1+), Moderate (2+), Large (3+), 4+   Urobilinogen, UA     Nitrite, UA neg    Leukocytes, UA Negative Negative   Appearance     Odor      Assessment & Plan:  1) Low-risk pregnancy [id number] at [redacted]w[redacted]d with an Estimated Date of Delivery: 06/01/19     Meds: No orders of the defined types were placed in this encounter.  Labs/procedures today: PN2, Tdap  Plan:  Continue routine obstetrical care   Reviewed: Preterm labor symptoms and general obstetric precautions including but not limited to vaginal bleeding, contractions, leaking of fluid and fetal movement were reviewed in detail with the patient.  All questions were answered. Has home bp cuff.  Check bp weekly, let us know if >140/90.   Follow-up: Return in about 3 weeks (around 03/24/2019) for LROB, MyChart video.  Orders Placed This Encounter  Procedures  . Tdap vaccine greater than or equal to 7yo IM  . POC Urinalysis Dipstick OB   Michelle Larson [facility] 03/03/2019 9:22 AM

## 2019-03-04 LAB — GLUCOSE TOLERANCE, 2 HOURS W/ 1HR
Glucose, 1 hour: 140 mg/dL (ref 65–179)
Glucose, 2 hour: 111 mg/dL (ref 65–152)
Glucose, Fasting: 84 mg/dL (ref 65–91)

## 2019-03-04 LAB — HIV ANTIBODY (ROUTINE TESTING W REFLEX): HIV Screen 4th Generation wRfx: NONREACTIVE

## 2019-03-04 LAB — CBC
Hematocrit: 34.6 % (ref 34.0–46.6)
Hemoglobin: 11.6 g/dL (ref 11.1–15.9)
MCH: 30 pg (ref 26.6–33.0)
MCHC: 33.5 g/dL (ref 31.5–35.7)
MCV: 89 fL (ref 79–97)
Platelets: 277 10*3/uL (ref 150–450)
RBC: 3.87 x10E6/uL (ref 3.77–5.28)
RDW: 12.1 % (ref 11.7–15.4)
WBC: 8.3 10*3/uL (ref 3.4–10.8)

## 2019-03-04 LAB — ANTIBODY SCREEN: Antibody Screen: NEGATIVE

## 2019-03-04 LAB — RPR: RPR Ser Ql: NONREACTIVE

## 2019-03-24 ENCOUNTER — Other Ambulatory Visit: Payer: Self-pay

## 2019-03-24 ENCOUNTER — Telehealth (INDEPENDENT_AMBULATORY_CARE_PROVIDER_SITE_OTHER): Payer: Medicaid Other | Admitting: Women's Health

## 2019-03-24 ENCOUNTER — Encounter: Payer: Self-pay | Admitting: Women's Health

## 2019-03-24 VITALS — BP 126/61 | HR 105

## 2019-03-24 DIAGNOSIS — Z3483 Encounter for supervision of other normal pregnancy, third trimester: Secondary | ICD-10-CM

## 2019-03-24 DIAGNOSIS — Z3A30 30 weeks gestation of pregnancy: Secondary | ICD-10-CM

## 2019-03-24 NOTE — Patient Instructions (Addendum)
[facility], I greatly value your feedback.  If you receive a survey following your visit with us today, we appreciate you taking the time to fill it out.  Thanks, Joellyn HaffKim Rashonda Warrior, CNM, [facility]NP-BC  [facility] HAS MOVED!!! It is now [facility] ([address] [place], [place] [address]) Entrance located off of [address] Free 24/7 valet parking    Go to [email or web address] to register for FREE online childbirth classes   For your pressure/lower back pain you may:  Purchase a pregnancy/maternity support belt from [employer], [employer], [employer], [employer], etc and wear it while you are up and about  Take warm baths  Use a heating pad to your lower back for no longer than 20 minutes at a time, and do not place near abdomen  Take tylenol as needed. Please follow directions on the bottle  Kinesiology tape (can get from sporting goods store), google how to tape belly for pregnancy    Call the office [phone]) or go to [facility] if:  You begin to have strong, frequent contractions  Your water breaks.  Sometimes it is a big gush of fluid, sometimes it is just a trickle that keeps getting your panties wet or running down your legs  You have vaginal bleeding.  It is normal to have a small amount of spotting if your cervix was checked.   You don't feel your baby moving like normal.  If you don't, get you something to eat and drink and lay down and focus on feeling your baby move.  You should feel at least 10 movements in 2 hours.  If you don't, you should call the office or go to [facility].    Tdap Vaccine  It is recommended that you get the Tdap vaccine during the third trimester of EACH pregnancy to help protect your baby from getting pertussis (whooping cough)  27-36 weeks is the BEST time to do this so that you can pass the protection on to your baby. During pregnancy is better than after pregnancy, but if you are unable to get it  during pregnancy it will be offered at the hospital.   You can get this vaccine with us, at the health department, your family doctor, or some local pharmacies  Everyone who will be around your baby should also be up-to-date on their vaccines before the baby comes. Adults (who are not pregnant) only need 1 dose of Tdap during adulthood.   [place] Pediatricians/Family Doctors:  [facility] [phone]            [facility] [phone]                 [facility] [phone] (usually not accepting new patients unless you have family there already, you are always welcome to call and ask)       [facility] [phone]       [facility]/Family [facility]:   [facility]: [phone]  [facility]: [phone]  [facility]: [phone]  [facility]:   [facility]: [phone]   [facility]: [phone]  [facility]:  [facility]: [phone]   Home Blood Pressure Monitoring for Patients   Your provider has recommended that you check your blood pressure (BP) at least once a week at home. If you do not have a blood pressure cuff at home, one will be provided for you. Contact your provider if  you have not received your monitor within 1 week.   Helpful Tips for Accurate Home Blood Pressure Checks  . Don't smoke, exercise, or drink caffeine 30 minutes before checking your BP . Use the restroom before checking your BP (a full bladder can raise your pressure) . Relax in a comfortable upright chair . Feet on the ground . Left arm resting comfortably on a flat surface at the level of your heart . Legs uncrossed . Back supported . Sit quietly and don't talk . Place the cuff on your bare arm . Adjust snuggly, so that only two fingertips can fit between your skin and the top of the  cuff . Check 2 readings separated by at least one minute . Keep a log of your BP readings . For a visual, please reference this diagram: http://ccnc.care/bpdiagram  Provider Name: [facility]     Phone: [phone]  Zone 1: ALL CLEAR  Continue to monitor your symptoms:  . BP reading is less than 140 (top number) or less than 90 (bottom number)  . No right upper stomach pain . No headaches or seeing spots . No feeling nauseated or throwing up . No swelling in face and hands  Zone 2: CAUTION Call your doctor's office for any of the following:  . BP reading is greater than 140 (top number) or greater than 90 (bottom number)  . Stomach pain under your ribs in the middle or right side . Headaches or seeing spots . Feeling nauseated or throwing up . Swelling in face and hands  Zone 3: EMERGENCY  Seek immediate medical care if you have any of the following:  . BP reading is greater than160 (top number) or greater than 110 (bottom number) . Severe headaches not improving with Tylenol . Serious difficulty catching your breath . Any worsening symptoms from Zone 2   Third Trimester of Pregnancy The third trimester is from week 29 through week 42, months 7 through 9. The third trimester is a time when the fetus is growing rapidly. At the end of the ninth month, the fetus is about 20 inches in length and weighs 6-10 pounds.  BODY CHANGES Your body goes through many changes during pregnancy. The changes vary from woman to woman.   Your weight will continue to increase. You can expect to gain 25-35 pounds (11-16 kg) by the end of the pregnancy.  You may begin to get stretch marks on your hips, abdomen, and breasts.  You may urinate more often because the fetus is moving lower into your pelvis and pressing on your bladder.  You may develop or continue to have heartburn as a result of your pregnancy.  You may develop constipation because certain hormones are causing the muscles that  push waste through your intestines to slow down.  You may develop hemorrhoids or swollen, bulging veins (varicose veins).  You may have pelvic pain because of the weight gain and pregnancy hormones relaxing your joints between the bones in your pelvis. Backaches may result from overexertion of the muscles supporting your posture.  You may have changes in your hair. These can include thickening of your hair, rapid growth, and changes in texture. Some women also have hair loss during or after pregnancy, or hair that feels dry or thin. Your hair will most likely return to normal after your baby is born.  Your breasts will continue to grow and be tender. A yellow discharge may leak from your breasts called colostrum.  Your belly  button may stick out.  You may feel short of breath because of your expanding uterus.  You may notice the fetus "dropping," or moving lower in your abdomen.  You may have a bloody mucus discharge. This usually occurs a few days to a week before labor begins.  Your cervix becomes thin and soft (effaced) near your due date. WHAT TO EXPECT AT YOUR PRENATAL EXAMS  You will have prenatal exams every 2 weeks until week 36. Then, you will have weekly prenatal exams. During a routine prenatal visit:  You will be weighed to make sure you and the fetus are growing normally.  Your blood pressure is taken.  Your abdomen will be measured to track your baby's growth.  The fetal heartbeat will be listened to.  Any test results from the previous visit will be discussed.  You may have a cervical check near your due date to see if you have effaced. At around 36 weeks, your caregiver will check your cervix. At the same time, your caregiver will also perform a test on the secretions of the vaginal tissue. This test is to determine if a type of bacteria, Group B streptococcus, is present. Your caregiver will explain this further. Your caregiver may ask you:  What your birth plan  is.  How you are feeling.  If you are feeling the baby move.  If you have had any abnormal symptoms, such as leaking fluid, bleeding, severe headaches, or abdominal cramping.  If you have any questions. Other tests or screenings that may be performed during your third trimester include:  Blood tests that check for low iron levels (anemia).  Fetal testing to check the health, activity level, and growth of the fetus. Testing is done if you have certain medical conditions or if there are problems during the pregnancy. FALSE LABOR You may feel small, irregular contractions that eventually go away. These are called Braxton Hicks contractions, or false labor. Contractions may last for hours, days, or even weeks before true labor sets in. If contractions come at regular intervals, intensify, or become painful, it is best to be seen by your caregiver.  SIGNS OF LABOR   Menstrual-like cramps.  Contractions that are 5 minutes apart or less.  Contractions that start on the top of the uterus and spread down to the lower abdomen and back.  A sense of increased pelvic pressure or back pain.  A watery or bloody mucus discharge that comes from the vagina. If you have any of these signs before the 37th week of pregnancy, call your caregiver right away. You need to go to the hospital to get checked immediately. HOME CARE INSTRUCTIONS   Avoid all smoking, herbs, alcohol, and unprescribed drugs. These chemicals affect the formation and growth of the baby.  Follow your caregiver's instructions regarding medicine use. There are medicines that are either safe or unsafe to take during pregnancy.  Exercise only as directed by your caregiver. Experiencing uterine cramps is a good sign to stop exercising.  Continue to eat regular, healthy meals.  Wear a good support bra for breast tenderness.  Do not use hot tubs, steam rooms, or saunas.  Wear your seat belt at all times when driving.  Avoid raw  meat, uncooked cheese, cat litter boxes, and soil used by cats. These carry germs that can cause birth defects in the baby.  Take your prenatal vitamins.  Try taking a stool softener (if your caregiver approves) if you develop constipation. Eat more high-fiber foods,  such as fresh vegetables or fruit and whole grains. Drink plenty of fluids to keep your urine clear or pale yellow.  Take warm sitz baths to soothe any pain or discomfort caused by hemorrhoids. Use hemorrhoid cream if your caregiver approves.  If you develop varicose veins, wear support hose. Elevate your feet for 15 minutes, 3-4 times a day. Limit salt in your diet.  Avoid heavy lifting, wear low heal shoes, and practice good posture.  Rest a lot with your legs elevated if you have leg cramps or low back pain.  Visit your dentist if you have not gone during your pregnancy. Use a soft toothbrush to brush your teeth and be gentle when you floss.  A sexual relationship may be continued unless your caregiver directs you otherwise.  Do not travel far distances unless it is absolutely necessary and only with the approval of your caregiver.  Take prenatal classes to understand, practice, and ask questions about the labor and delivery.  Make a trial run to the hospital.  Pack your hospital bag.  Prepare the baby's nursery.  Continue to go to all your prenatal visits as directed by your caregiver. SEEK MEDICAL CARE IF:  You are unsure if you are in labor or if your water has broken.  You have dizziness.  You have mild pelvic cramps, pelvic pressure, or nagging pain in your abdominal area.  You have persistent nausea, vomiting, or diarrhea.  You have a bad smelling vaginal discharge.  You have pain with urination. SEEK IMMEDIATE MEDICAL CARE IF:   You have a fever.  You are leaking fluid from your vagina.  You have spotting or bleeding from your vagina.  You have severe abdominal cramping or pain.  You have  rapid weight loss or gain.  You have shortness of breath with chest pain.  You notice sudden or extreme swelling of your face, hands, ankles, feet, or legs.  You have not felt your baby move in over an hour.  You have severe headaches that do not go away with medicine.  You have vision changes. Document Released: 12/16/2000 Document Revised: 12/27/2012 Document Reviewed: 02/23/2012 [facility] Patient Information 2015 [place], [place]. This information is not intended to replace advice given to you by your health care provider. Make sure you discuss any questions you have with your health care provider.  PROTECT YOURSELF & YOUR BABY FROM THE FLU! Because you are pregnant, we at [facility], along with the Centers for Disease Control (CDC), recommend that you receive the flu vaccine to protect yourself and your baby from the flu. The flu is more likely to cause severe illness in pregnant women than in women of reproductive age who are not pregnant. Changes in the immune system, heart, and lungs during pregnancy make pregnant women (and women up to two weeks postpartum) more prone to severe illness from flu, including illness resulting in hospitalization. Flu also may be harmful for a pregnant woman's developing baby. A common flu symptom is fever, which may be associated with neural tube defects and other adverse outcomes for a developing baby. Getting vaccinated can also help protect a baby after birth from flu. (Mom passes antibodies onto the developing baby during her pregnancy.)  A Flu Vaccine is the Best Protection Against Flu Getting a flu vaccine is the first and most important step in protecting against flu. Pregnant women should get a flu shot and not the live attenuated influenza vaccine (LAIV), also known as nasal spray flu vaccine. Flu  vaccines given during pregnancy help protect both the mother and her baby from flu. Vaccination has been shown to reduce the risk of flu-associated acute  respiratory infection in pregnant women by up to one-half. A 2018 study showed that getting a flu shot reduced a pregnant woman's risk of being hospitalized with flu by an average of 40 percent. Pregnant women who get a flu vaccine are also helping to protect their babies from flu illness for the first several months after their birth, when they are too young to get vaccinated.   A Long Record of Safety for Flu Shots in Pregnant Women Flu shots have been given to millions of pregnant women over many years with a good safety record. There is a lot of evidence that flu vaccines can be given safely during pregnancy; though these data are limited for the first trimester. The CDC recommends that pregnant women get vaccinated during any trimester of their pregnancy. It is very important for pregnant women to get the flu shot.   Other Preventive Actions In addition to getting a flu shot, pregnant women should take the same everyday preventive actions the CDC recommends of everyone, including covering coughs, washing hands often, and avoiding people who are sick.  Symptoms and Treatment If you get sick with flu symptoms call your doctor right away. There are antiviral drugs that can treat flu illness and prevent serious flu complications. The CDC recommends prompt treatment for people who have influenza infection or suspected influenza infection and who are at high risk of serious flu complications, such as people with asthma, diabetes (including gestational diabetes), or heart disease. Early treatment of influenza in hospitalized pregnant women has been shown to reduce the length of the hospital stay.  Symptoms Flu symptoms include fever, cough, sore throat, runny or stuffy nose, body aches, headache, chills and fatigue. Some people may also have vomiting and diarrhea. People may be infected with the flu and have respiratory symptoms without a fever.  Early Treatment is Important for Pregnant Women Treatment  should begin as soon as possible because antiviral drugs work best when started early (within 48 hours after symptoms start). Antiviral drugs can make your flu illness milder and make you feel better faster. They may also prevent serious health problems that can result from flu illness. Oral oseltamivir (Tamiflu) is the preferred treatment for pregnant women because it has the most studies available to suggest that it is safe and beneficial. Antiviral drugs require a prescription from your provider. Having a fever caused by flu infection or other infections early in pregnancy may be linked to birth defects in a baby. In addition to taking antiviral drugs, pregnant women who get a fever should treat their fever with Tylenol (acetaminophen) and contact their provider immediately.  When to Seek Emergency Medical Care If you are pregnant and have any of these signs, seek care immediately:  Difficulty breathing or shortness of breath  Pain or pressure in the chest or abdomen  Sudden dizziness  Confusion  Severe or persistent vomiting  High fever that is not responding to Tylenol (or store brand equivalent)  Decreased or no movement of your baby  [email or web address]

## 2019-03-24 NOTE — Progress Notes (Signed)
   TELEHEALTH VIRTUAL OBSTETRICS VISIT ENCOUNTER NOTE Patient name: Michelle Larson MRN [id number]  Date of birth: 1995/01/07  I connected with patient on 03/24/19 at 12:50 PM EDT by MyChart video  and verified that I am speaking with the correct person using two identifiers. Due to COVID-19 recommendations, pt is not currently in our office.    I discussed the limitations, risks, security and privacy concerns of performing an evaluation and management service by telephone and the availability of in person appointments. I also discussed with the patient that there may be a patient responsible charge related to this service. The patient expressed understanding and agreed to proceed.  Chief Complaint:   Routine Prenatal Visit  History of Present Illness:   Michelle Larson is a 24 y.o. [phone] female at [redacted]w[redacted]d with an Estimated Date of Delivery: 06/01/19 being evaluated today for ongoing management of a low-risk pregnancy.  Today she reports pressure. Contractions: Not present. Vag. Bleeding: None.  Movement: Present. denies leaking of fluid. Review of Systems:   Pertinent items are noted in HPI Denies abnormal vaginal discharge w/ itching/odor/irritation, headaches, visual changes, shortness of breath, chest pain, abdominal pain, severe nausea/vomiting, or problems with urination or bowel movements unless otherwise stated above. Pertinent History Reviewed:  Reviewed past medical,surgical, social, obstetrical and family history.  Reviewed problem list, medications and allergies. Physical Assessment:   Vitals:   03/24/19 1248  BP: 126/61  Pulse: (!) 105  There is no height or weight on file to calculate BMI.        Physical Examination:   General:  Alert, oriented and cooperative.   Mental Status: Normal mood and affect perceived. Normal judgment and thought content.  Rest of physical exam deferred due to type of encounter  No results found for this or any previous visit (from the  past 24 hour(s)).  Assessment & Plan:  1) Pregnancy [id number] at [redacted]w[redacted]d with an Estimated Date of Delivery: 06/01/19   2) Pressure, discussed belts/tape   Meds: No orders of the defined types were placed in this encounter.   Labs/procedures today: none  Plan:  Continue routine obstetrical care.  Has home bp cuff.  Check bp weekly, let us know if >140/90.  Next visit: prefers online    Reviewed: Preterm labor symptoms and general obstetric precautions including but not limited to vaginal bleeding, contractions, leaking of fluid and fetal movement were reviewed in detail with the patient. The patient was advised to call back or seek an in-person office evaluation/go to [facility] at [facility] for any urgent or concerning symptoms. All questions were answered. Please refer to After Visit Summary for other counseling recommendations.    I provided 15 minutes of non-face-to-face time during this encounter.  Follow-up: Return in about 2 weeks (around 04/07/2019) for LROB, MyChart Video, CNM.  No orders of the defined types were placed in this encounter.  Cheral Marker CNM, [facility] 03/24/2019 1:12 PM

## 2019-04-10 ENCOUNTER — Telehealth (INDEPENDENT_AMBULATORY_CARE_PROVIDER_SITE_OTHER): Payer: Medicaid Other | Admitting: Advanced Practice Midwife

## 2019-04-10 ENCOUNTER — Other Ambulatory Visit: Payer: Self-pay

## 2019-04-10 ENCOUNTER — Encounter: Payer: Self-pay | Admitting: Advanced Practice Midwife

## 2019-04-10 DIAGNOSIS — Z3483 Encounter for supervision of other normal pregnancy, third trimester: Secondary | ICD-10-CM

## 2019-04-10 DIAGNOSIS — Z3A32 32 weeks gestation of pregnancy: Secondary | ICD-10-CM | POA: Diagnosis not present

## 2019-04-10 NOTE — Progress Notes (Signed)
   TELEHEALTH VIRTUAL OBSTETRICS VISIT ENCOUNTER NOTE  I connected with [name] on 04/10/19 at  1:50 PM EDT by telephone at home and verified that I am speaking with the correct person using two identifiers.   I discussed the limitations, risks, security and privacy concerns of performing an evaluation and management service by telephone and the availability of in person appointments. I also discussed with the patient that there may be a patient responsible charge related to this service. The patient expressed understanding and agreed to proceed.  Subjective:  Michelle Larson is a 24 y.o. [phone] at [redacted]w[redacted]d being followed for ongoing prenatal care.  She is currently monitored for the following issues for this low-risk pregnancy and has Supervision of normal pregnancy on their problem list.  Patient reports  Being interested in waterbirth, inverted nipples.  Reports fetal movement. Denies any contractions, bleeding or leaking of fluid.   The following portions of the patient's history were reviewed and updated as appropriate: allergies, current medications, past family history, past medical history, past social history, past surgical history and problem list.   Objective:   General:  Alert, oriented and cooperative.   Mental Status: Normal mood and affect perceived. Normal judgment and thought content.  Rest of physical exam deferred due to type of encounter  Assessment and Plan:  Pregnancy: [id number] at [redacted]w[redacted]d 1. Encounter for supervision of other normal pregnancy in third trimester 2.  Info on [email or web address] for classes on waterbirth and breastfeeding. May consider nipple shields  Preterm labor symptoms and general obstetric precautions including but not limited to vaginal bleeding, contractions, leaking of fluid and fetal movement were reviewed in detail with the patient.  I discussed the assessment and treatment plan with the patient. The patient was provided an  opportunity to ask questions and all were answered. The patient agreed with the plan and demonstrated an understanding of the instructions. The patient was advised to call back or seek an in-person office evaluation/go to [facility]U at [facility] for any urgent or concerning symptoms. Please refer to After Visit Summary for other counseling recommendations.   I provided 10 minutes of non-face-to-face time during this encounter.  Return in about 2 weeks (around 04/24/2019) for LROB.  Future Appointments  Date Time Provider Department Center  04/24/2019  3:10 PM Cheral Marker, CNM [facility]    Jacklyn Shell, CNM Center for [employer], [facility]

## 2019-04-10 NOTE — Patient Instructions (Addendum)
[facility], I greatly value your feedback.  If you receive a survey following your visit with [place] today, we appreciate you taking the time to fill it out.  Thanks, Cathie Beams, DNP, CNM  [facility] HAS MOVED!!! It is now [facility] at [facility] ([address] [place], [place] [address]) Entrance located off of [address] Free 24/7 valet parking   Go to [email or web address] to register for FREE online childbirth classes    Call the office [phone]) or go to [facility] if:  You begin to have strong, frequent contractions  Your water breaks.  Sometimes it is a big gush of fluid, sometimes it is just a trickle that keeps getting your panties wet or running down your legs  You have vaginal bleeding.  It is normal to have a small amount of spotting if your cervix was checked.   You don't feel your baby moving like normal.  If you don't, get you something to eat and drink and lay down and focus on feeling your baby move.  You should feel at least 10 movements in 2 hours.  If you don't, you should call the office or go to [facility].   Home Blood Pressure Monitoring for Patients   Your provider has recommended that you check your blood pressure (BP) at least once a week at home. If you do not have a blood pressure cuff at home, one will be provided for you. Contact your provider if you have not received your monitor within 1 week.   Helpful Tips for Accurate Home Blood Pressure Checks  . Don't smoke, exercise, or drink caffeine 30 minutes before checking your BP . Use the restroom before checking your BP (a full bladder can raise your pressure) . Relax in a comfortable upright chair . Feet on the ground . Left arm resting comfortably on a flat surface at the level of your heart . Legs uncrossed . Back supported . Sit quietly and don't talk . Place the cuff on your bare arm . Adjust snuggly, so that only two fingertips can  fit between your skin and the top of the cuff . Check 2 readings separated by at least one minute . Keep a log of your BP readings . For a visual, please reference this diagram: http://ccnc.care/bpdiagram  Provider Name: [facility]     Phone: [phone]  Zone 1: ALL CLEAR  Continue to monitor your symptoms:  . BP reading is less than 140 (top number) or less than 90 (bottom number)  . No right upper stomach pain . No headaches or seeing spots . No feeling nauseated or throwing up . No swelling in face and hands  Zone 2: CAUTION Call your doctor's office for any of the following:  . BP reading is greater than 140 (top number) or greater than 90 (bottom number)  . Stomach pain under your ribs in the middle or right side . Headaches or seeing spots . Feeling nauseated or throwing up . Swelling in face and hands  Zone 3: EMERGENCY  Seek immediate medical care if you have any of the following:  . BP reading is greater than160 (top number) or greater than 110 (bottom number) . Severe headaches not improving with Tylenol . Serious difficulty catching your breath . Any worsening symptoms from Zone 2     [email or web address] for waterbirth classes  Nipple shells/nipple shields for inverted nipples

## 2019-04-24 ENCOUNTER — Other Ambulatory Visit: Payer: Self-pay

## 2019-04-24 ENCOUNTER — Encounter: Payer: Medicaid Other | Admitting: Women's Health

## 2019-04-24 ENCOUNTER — Encounter: Payer: Self-pay | Admitting: Women's Health

## 2019-04-24 ENCOUNTER — Ambulatory Visit (INDEPENDENT_AMBULATORY_CARE_PROVIDER_SITE_OTHER): Payer: Medicaid Other | Admitting: Women's Health

## 2019-04-24 VITALS — BP 113/65 | HR 92

## 2019-04-24 DIAGNOSIS — Z3A34 34 weeks gestation of pregnancy: Secondary | ICD-10-CM

## 2019-04-24 DIAGNOSIS — Z331 Pregnant state, incidental: Secondary | ICD-10-CM

## 2019-04-24 DIAGNOSIS — Z3483 Encounter for supervision of other normal pregnancy, third trimester: Secondary | ICD-10-CM

## 2019-04-24 DIAGNOSIS — Z1389 Encounter for screening for other disorder: Secondary | ICD-10-CM

## 2019-04-24 LAB — POCT URINALYSIS DIPSTICK OB
Blood, UA: NEGATIVE
Glucose, UA: NEGATIVE
Nitrite, UA: NEGATIVE
POC,PROTEIN,UA: NEGATIVE

## 2019-04-24 NOTE — Progress Notes (Signed)
   LOW-RISK PREGNANCY VISIT Patient name: Michelle Larson MRN [id number]  Date of birth: 05/23/1995 Chief Complaint:   Routine Prenatal Visit  History of Present Illness:   Michelle Larson is a 24 y.o. [phone] female at [redacted]w[redacted]d with an Estimated Date of Delivery: 06/01/19 being seen today for ongoing management of a low-risk pregnancy.  Depression screen PHQ 2/9 12/07/2018  Decreased Interest 0  Down, Depressed, Hopeless 0  PHQ - 2 Score 0    Today she reports no complaints. Still interested in waterbirth, hasn't taken class yet.  Contractions: Not present.  .  Movement: Present. denies leaking of fluid. Review of Systems:   Pertinent items are noted in HPI Denies abnormal vaginal discharge w/ itching/odor/irritation, headaches, visual changes, shortness of breath, chest pain, abdominal pain, severe nausea/vomiting, or problems with urination or bowel movements unless otherwise stated above. Pertinent History Reviewed:  Reviewed past medical,surgical, social, obstetrical and family history.  Reviewed problem list, medications and allergies. Physical Assessment:   Vitals:   04/24/19 1541  BP: 113/65  Pulse: 92  There is no height or weight on file to calculate BMI.        Physical Examination:   General appearance: Well appearing, and in no distress  Mental status: Alert, oriented to person, place, and time  Skin: Warm & dry  Cardiovascular: Normal heart rate noted  Respiratory: Normal respiratory effort, no distress  Abdomen: Soft, gravid, nontender  Pelvic: Cervical exam deferred         Extremities: Edema: Trace  Fetal Status: Fetal Heart Rate (bpm): 140 Fundal Height: 34 cm Movement: Present    Chaperone: n/a    Results for orders placed or performed in visit on 04/24/19 (from the past 24 hour(s))  POC Urinalysis Dipstick OB   Collection Time: 04/24/19  3:49 PM  Result Value Ref Range   Color, UA     Clarity, UA     Glucose, UA Negative Negative   Bilirubin, UA      Ketones, UA large    Spec Grav, UA     Blood, UA neg    pH, UA     POC,PROTEIN,UA Negative Negative, Trace, Small (1+), Moderate (2+), Large (3+), 4+   Urobilinogen, UA     Nitrite, UA neg    Leukocytes, UA Moderate (2+) (A) Negative   Appearance     Odor      Assessment & Plan:  1) Low-risk pregnancy [id number] at [redacted]w[redacted]d with an Estimated Date of Delivery: 06/01/19   2) Interested in Systems developer, to sign up for WB class asap, CNM visit @ 36wks for consent   Meds: No orders of the defined types were placed in this encounter.  Labs/procedures today: none  Plan:  Continue routine obstetrical care  Next visit: prefers in person for gbs  Reviewed: Preterm labor symptoms and general obstetric precautions including but not limited to vaginal bleeding, contractions, leaking of fluid and fetal movement were reviewed in detail with the patient.  All questions were answered.   Follow-up: Return in about 2 weeks (around 05/08/2019) for LROB, in person, CNM.  Orders Placed This Encounter  Procedures  . POC Urinalysis Dipstick OB   Cheral Marker CNM, [facility] 04/24/2019 4:13 PM

## 2019-04-24 NOTE — Patient Instructions (Signed)
[facility], I greatly value your feedback.  If you receive a survey following your visit with [place] today, we appreciate you taking the time to fill it out.  Thanks, Joellyn Haff, CNM, WHNP-BC  [facility] at [facility] ([address] [place], [place] [address]) Entrance C, located off of [address] valet parking   Go to [email or web address] to register for FREE online childbirth classes    Call the office [phone]) or go to [facility] if:  You begin to have strong, frequent contractions  Your water breaks.  Sometimes it is a big gush of fluid, sometimes it is just a trickle that keeps getting your panties wet or running down your legs  You have vaginal bleeding.  It is normal to have a small amount of spotting if your cervix was checked.   You don't feel your baby moving like normal.  If you don't, get you something to eat and drink and lay down and focus on feeling your baby move.  You should feel at least 10 movements in 2 hours.  If you don't, you should call the office or go to [facility].   Call the office [phone]) or go to [facility] hospital for these signs of pre-eclampsia:  Severe headache that does not go away with Tylenol  Visual changes- seeing spots, double, blurred vision  Pain under your right breast or upper abdomen that does not go away with Tums or heartburn medicine  Nausea and/or vomiting  Severe swelling in your hands, feet, and face    Home Blood Pressure Monitoring for Patients   Your provider has recommended that you check your blood pressure (BP) at least once a week at home. If you do not have a blood pressure cuff at home, one will be provided for you. Contact your provider if you have not received your monitor within 1 week.   Helpful Tips for Accurate Home Blood Pressure Checks  . Don't smoke, exercise, or drink caffeine 30 minutes before checking your BP . Use the restroom before checking your BP (a full  bladder can raise your pressure) . Relax in a comfortable upright chair . Feet on the ground . Left arm resting comfortably on a flat surface at the level of your heart . Legs uncrossed . Back supported . Sit quietly and don't talk . Place the cuff on your bare arm . Adjust snuggly, so that only two fingertips can fit between your skin and the top of the cuff . Check 2 readings separated by at least one minute . Keep a log of your BP readings . For a visual, please reference this diagram: http://ccnc.care/bpdiagram  Provider Name: [facility]     Phone: [phone]  Zone 1: ALL CLEAR  Continue to monitor your symptoms:  . BP reading is less than 140 (top number) or less than 90 (bottom number)  . No right upper stomach pain . No headaches or seeing spots . No feeling nauseated or throwing up . No swelling in face and hands  Zone 2: CAUTION Call your doctor's office for any of the following:  . BP reading is greater than 140 (top number) or greater than 90 (bottom number)  . Stomach pain under your ribs in the middle or right side . Headaches or seeing spots . Feeling nauseated or throwing up . Swelling in face and hands  Zone 3: EMERGENCY  Seek immediate medical care if you have any of the  following:  . BP reading is greater than160 (top number) or greater than 110 (bottom number) . Severe headaches not improving with Tylenol . Serious difficulty catching your breath . Any worsening symptoms from Zone 2  Preterm Labor and Birth Information  The normal length of a pregnancy is 39-41 weeks. Preterm labor is when labor starts before 37 completed weeks of pregnancy. What are the risk factors for preterm labor? Preterm labor is more likely to occur in women who:  Have certain infections during pregnancy such as a bladder infection, sexually transmitted infection, or infection inside the uterus (chorioamnionitis).  Have a shorter-than-normal cervix.  Have gone into  preterm labor before.  Have had surgery on their cervix.  Are younger than age 41 or older than age 6.  Are African American.  Are pregnant with twins or multiple babies (multiple gestation).  Take street drugs or smoke while pregnant.  Do not gain enough weight while pregnant.  Became pregnant shortly after having been pregnant. What are the symptoms of preterm labor? Symptoms of preterm labor include:  Cramps similar to those that can happen during a menstrual period. The cramps may happen with diarrhea.  Pain in the abdomen or lower back.  Regular uterine contractions that may feel like tightening of the abdomen.  A feeling of increased pressure in the pelvis.  Increased watery or bloody mucus discharge from the vagina.  Water breaking (ruptured amniotic sac). Why is it important to recognize signs of preterm labor? It is important to recognize signs of preterm labor because babies who are born prematurely may not be fully developed. This can put them at an increased risk for:  Long-term (chronic) heart and lung problems.  Difficulty immediately after birth with regulating body systems, including blood sugar, body temperature, heart rate, and breathing rate.  Bleeding in the brain.  Cerebral palsy.  Learning difficulties.  Death. These risks are highest for babies who are born before 21 weeks of pregnancy. How is preterm labor treated? Treatment depends on the length of your pregnancy, your condition, and the health of your baby. It may involve: 1. Having a stitch (suture) placed in your cervix to prevent your cervix from opening too early (cerclage). 2. Taking or being given medicines, such as: ? Hormone medicines. These may be given early in pregnancy to help support the pregnancy. ? Medicine to stop contractions. ? Medicines to help mature the baby's lungs. These may be prescribed if the risk of delivery is high. ? Medicines to prevent your baby from  developing cerebral palsy. If the labor happens before 34 weeks of pregnancy, you may need to stay in the hospital. What should I do if I think I am in preterm labor? If you think that you are going into preterm labor, call your health care provider right away. How can I prevent preterm labor in future pregnancies? To increase your chance of having a full-term pregnancy:  Do not use any tobacco products, such as cigarettes, chewing tobacco, and e-cigarettes. If you need help quitting, ask your health care provider.  Do not use street drugs or medicines that have not been prescribed to you during your pregnancy.  Talk with your health care provider before taking any herbal supplements, even if you have been taking them regularly.  Make sure you gain a healthy amount of weight during your pregnancy.  Watch for infection. If you think that you might have an infection, get it checked right away.  Make sure to tell  your health care provider if you have gone into preterm labor before. This information is not intended to replace advice given to you by your health care provider. Make sure you discuss any questions you have with your health care provider. Document Revised: 04/15/2018 Document Reviewed: 05/15/2015 Elsevier Patient Education  [place].

## 2019-05-05 ENCOUNTER — Encounter (HOSPITAL_COMMUNITY): Payer: Self-pay | Admitting: Emergency Medicine

## 2019-05-05 ENCOUNTER — Other Ambulatory Visit: Payer: Self-pay

## 2019-05-05 ENCOUNTER — Emergency Department (HOSPITAL_COMMUNITY)
Admission: EM | Admit: 2019-05-05 | Discharge: 2019-05-06 | Disposition: A | Discharge status: Home / Self Care | Payer: Medicaid Other | Attending: Emergency Medicine | Admitting: Emergency Medicine

## 2019-05-05 DIAGNOSIS — J189 Pneumonia, unspecified organism: Secondary | ICD-10-CM | POA: Diagnosis not present

## 2019-05-05 DIAGNOSIS — Z79899 Other long term (current) drug therapy: Secondary | ICD-10-CM | POA: Insufficient documentation

## 2019-05-05 DIAGNOSIS — Z20822 Contact with and (suspected) exposure to covid-19: Secondary | ICD-10-CM | POA: Insufficient documentation

## 2019-05-05 DIAGNOSIS — R05 Cough: Secondary | ICD-10-CM | POA: Diagnosis not present

## 2019-05-05 DIAGNOSIS — Z3483 Encounter for supervision of other normal pregnancy, third trimester: Secondary | ICD-10-CM

## 2019-05-05 DIAGNOSIS — R0602 Shortness of breath: Secondary | ICD-10-CM | POA: Insufficient documentation

## 2019-05-05 DIAGNOSIS — R0981 Nasal congestion: Secondary | ICD-10-CM | POA: Diagnosis present

## 2019-05-05 NOTE — ED Triage Notes (Signed)
Patient reports nasal allergies onset last week with nasal congestion , runny nose and occasional cough , denies fever or chills , she is 9 months pregnant . No abdominal contractions or vaginal bleeding or discharge , denies fever or chills .

## 2019-05-06 ENCOUNTER — Emergency Department (HOSPITAL_COMMUNITY): Payer: Medicaid Other

## 2019-05-06 DIAGNOSIS — R05 Cough: Secondary | ICD-10-CM | POA: Diagnosis not present

## 2019-05-06 LAB — RESPIRATORY PANEL BY RT PCR (FLU A&B, COVID)
Influenza A by PCR: NEGATIVE
Influenza B by PCR: NEGATIVE
SARS Coronavirus 2 by RT PCR: NEGATIVE

## 2019-05-06 MED ORDER — AEROCHAMBER PLUS W/MASK MISC
1.0000 | Freq: Once | Status: AC
  Administered 2019-05-06: 1
  Filled 2019-05-06: qty 1

## 2019-05-06 MED ORDER — AMOXICILLIN-POT CLAVULANATE 875-125 MG PO TABS: 1.0000 | ORAL_TABLET | Freq: Two times a day (BID) | ORAL | 0 refills | Status: AC

## 2019-05-06 MED ORDER — ALBUTEROL SULFATE HFA 108 (90 BASE) MCG/ACT IN AERS
4.0000 | INHALATION_SPRAY | Freq: Once | RESPIRATORY_TRACT | Status: AC
  Administered 2019-05-06: 4 via RESPIRATORY_TRACT
  Filled 2019-05-06: qty 6.7

## 2019-05-06 MED ORDER — AEROCHAMBER PLUS FLO-VU LARGE MISC
Status: AC
  Administered 2019-05-06: 04:00:00 1
  Filled 2019-05-06: qty 1

## 2019-05-06 MED ORDER — DIPHENHYDRAMINE HCL 50 MG/ML IJ SOLN
25.0000 mg | Freq: Once | INTRAMUSCULAR | Status: AC
  Administered 2019-05-06: 25 mg via INTRAVENOUS
  Filled 2019-05-06: qty 1

## 2019-05-06 MED ORDER — ONDANSETRON HCL 4 MG/2ML IJ SOLN
4.0000 mg | Freq: Once | INTRAMUSCULAR | Status: AC
  Administered 2019-05-06: 04:00:00 4 mg via INTRAVENOUS
  Filled 2019-05-06: qty 2

## 2019-05-06 MED ORDER — AZITHROMYCIN 250 MG PO TABS: ORAL_TABLET | ORAL | 0 refills | Status: DC

## 2019-05-06 MED ORDER — SODIUM CHLORIDE 0.9 % IV BOLUS
1000.0000 mL | Freq: Once | INTRAVENOUS | Status: AC
  Administered 2019-05-06: 1000 mL via INTRAVENOUS

## 2019-05-06 MED ORDER — ONDANSETRON 4 MG PO TBDP: 4.0000 mg | ORAL_TABLET | Freq: Three times a day (TID) | ORAL | 1 refills | Status: DC | PRN

## 2019-05-06 NOTE — ED Provider Notes (Signed)
[facility] EMERGENCY DEPARTMENT Provider Note   CSN: [id number] Arrival date & time: 05/05/19  2138     History Chief Complaint  Patient presents with  . Allergic Rhinitis     Michelle Larson is a 24 y.o. female, [phone],  with no significant past medical history who presents to the emergency department with nasal congestion, itchy, watery eyes, rhinorrhea that began about a week ago.  Over the last week, she has developed a cough that has become increasingly productive with clear sputum, shortness of breath, wheezing, chills, nausea, scratchy throat.  She is also having some chest tightness that is associated with coughing.  She reports subjective fevers, but has not measured her temperature at home.  No abdominal pain, contractions, vaginal bleeding or discharge, vomiting, diarrhea, headache, back pain, neck pain or stiffness.  She has not been taking medications for her symptoms because she is currently 9 months pregnant.   EDD is 06/01/19.   The history is provided by the patient. No language interpreter was used.       History reviewed. No pertinent past medical history.  Patient Active Problem List   Diagnosis Date Noted  . Supervision of normal pregnancy 12/07/2018    Past Surgical History:  Procedure Laterality Date  . CHOLECYSTECTOMY       OB History    Gravida  4   Para  2   Term  2   Preterm      AB  1   Living  2     SAB  1   TAB      Ectopic      Multiple      Live Births  2           Family History  Problem Relation Age of Onset  . Diabetes Mother   . Hyperlipidemia Maternal Grandmother   . Diabetes Maternal Grandfather     Social History   Tobacco Use  . Smoking status: Never Smoker  . Smokeless tobacco: Never Used  Substance Use Topics  . Alcohol use: No  . Drug use: No    Home Medications Prior to Admission medications   Medication Sig Start Date End Date Taking? Authorizing Provider    Doxylamine-Pyridoxine 10-10 MG TBEC Take 2 po q hs and may take 1 in the morning and then 1 in the afternoon Patient taking differently: Take 1-2 tablets by mouth See admin instructions. Take 2 tablets at bedtime and may take 1 in the morning and then 1 in the afternoon 12/07/18  Yes Arabella Merles, CNM  Prenatal Vit-Fe Fumarate-FA (PRENATAL MULTIVITAMIN) TABS tablet Take 1 tablet by mouth daily.   Yes [provider]  amoxicillin-clavulanate (AUGMENTIN) 875-125 MG tablet Take 1 tablet by mouth every 12 (twelve) hours for 5 days. 05/06/19 05/11/19  Diavian Furgason A, PA-C  azithromycin (ZITHROMAX) 250 MG tablet Take 2 tablets by mouth on the first day then take 1 tablet daily 05/06/19   Saphronia Ozdemir A, PA-C  Blood Pressure Monitoring (BLOOD PRESSURE CUFF) MISC 1 Device by Does not apply route once a week. 01/04/19   Tilda Burrow, MD  ondansetron (ZOFRAN ODT) 4 MG disintegrating tablet Take 1 tablet (4 mg total) by mouth every 8 (eight) hours as needed for nausea or vomiting. 05/06/19   Celestine Prim A, PA-C    Allergies    Patient has no known allergies.  Review of Systems   Review of Systems  Constitutional: Positive for chills and  fever. Negative for activity change.  HENT: Positive for congestion, rhinorrhea and sore throat. Negative for ear discharge, ear pain, hearing loss, mouth sores, nosebleeds, sinus pressure, sinus pain, trouble swallowing and voice change.   Eyes: Positive for redness and itching.  Respiratory: Positive for cough, shortness of breath and wheezing.   Cardiovascular: Negative for chest pain.  Gastrointestinal: Positive for nausea. Negative for abdominal pain, blood in stool, constipation, diarrhea and vomiting.  Genitourinary: Negative for dysuria, flank pain, frequency, pelvic pain, vaginal bleeding, vaginal discharge and vaginal pain.  Musculoskeletal: Negative for back pain.  Skin: Negative for rash.  Allergic/Immunologic: Negative for immunocompromised  state.  Neurological: Negative for dizziness, weakness, numbness and headaches.  Psychiatric/Behavioral: Negative for confusion.    Physical Exam Updated Vital Signs BP 112/70 (BP Location: Right Arm)   Pulse 100   Temp 97.8 F (36.6 C) (Oral)   Resp 18   Ht 5\' 2"  (1.575 m)   Wt 100 kg   LMP 08/25/2018 (Exact Date)   SpO2 97%   BMI 40.32 kg/m   Physical Exam Vitals and nursing note reviewed.  Constitutional:      General: She is not in acute distress. HENT:     Head: Normocephalic.     Nose: Congestion and rhinorrhea present.     Mouth/Throat:     Comments: Lips are dry and cracked. Eyes:     Extraocular Movements: Extraocular movements intact.     Conjunctiva/sclera: Conjunctivae normal.     Pupils: Pupils are equal, round, and reactive to light.     Comments: Bilateral eyes are injected.  Neck:     Comments: No meningismus Cardiovascular:     Rate and Rhythm: Regular rhythm. Tachycardia present.     Heart sounds: No murmur. No friction rub. No gallop.   Pulmonary:     Effort: Pulmonary effort is normal. No respiratory distress.     Comments: Rales in the left lung base.  Lungs are otherwise clear to auscultation bilaterally.  No increased work of breathing. Abdominal:     General: There is no distension.     Palpations: Abdomen is soft. There is no mass.     Tenderness: There is no abdominal tenderness. There is no right CVA tenderness, left CVA tenderness, guarding or rebound.     Hernia: No hernia is present.  Musculoskeletal:     Cervical back: Normal range of motion and neck supple.     Right lower leg: No edema.     Left lower leg: No edema.  Skin:    General: Skin is warm.     Capillary Refill: Capillary refill takes less than 2 seconds.     Findings: No rash.  Neurological:     Mental Status: She is alert.  Psychiatric:        Behavior: Behavior normal.     ED Results / Procedures / Treatments   Labs (all labs ordered are listed, but only  abnormal results are displayed) Labs Reviewed  RESPIRATORY PANEL BY RT PCR (FLU A&B, COVID)    EKG None  Radiology DG Chest Portable 1 View  Result Date: 05/06/2019 CLINICAL DATA:  Cough EXAM: PORTABLE CHEST 1 VIEW COMPARISON:  None. FINDINGS: The heart size and mediastinal contours are within normal limits. Both lungs are clear. The visualized skeletal structures are unremarkable. IMPRESSION: No active disease. Electronically Signed   By: Ulyses Jarred M.D.   On: 05/06/2019 04:18    Procedures Procedures (including critical care time)  Medications  Ordered in ED Medications  sodium chloride 0.9 % bolus 1,000 mL (0 mLs Intravenous Stopped 05/06/19 0546)  ondansetron (ZOFRAN) injection 4 mg (4 mg Intravenous Given 05/06/19 0351)  albuterol (VENTOLIN HFA) 108 (90 Base) MCG/ACT inhaler 4 puff (4 puffs Inhalation Given 05/06/19 0352)  aerochamber plus with mask device 1 each (1 each Other Given 05/06/19 0418)  diphenhydrAMINE (BENADRYL) injection 25 mg (25 mg Intravenous Given 05/06/19 0427)    ED Course  I have reviewed the triage vital signs and the nursing notes.  Pertinent labs & imaging results that were available during my care of the patient were reviewed by me and considered in my medical decision making (see chart for details).    MDM Rules/Calculators/A&P                      24 year old female with no significant past medical history presenting with progressively worsening URI symptoms for the last week.  Minimally tachycardic on arrival.  Afebrile.  Vital signs are otherwise unremarkable.  Lips are dry and cracked.  Bilateral eyes are injected.  Clinically, she has a left lower lobe pneumonia on pulmonary exam.  Discussed obtaining a chest x-ray, patient is agreeable.  Chest x-ray is unremarkable on my evaluation.  Given current pregnancy, also discussed obtaining COVID-19 test.  COVID-19 test is negative.  Will give Benadryl for allergic symptoms and IV fluids since she appears  mildly dehydrated.  Albuterol given for shortness of breath.  On reevaluation, the patient is feeling much improved.  We will plan to discharge her home with azithromycin and Augmentin for clinical community-acquired pneumonia in the left lower lung as well as symptomatic treatment.  Although patient is having mild shortness of breath, I have a low suspicion for PE at this time.  The patient was discussed with Dr. Manus Gunning, attending physician.  At discharge, the patient was ambulated in the department and maintained an oxygen saturation of 97%.  She was also seen by rapid IV.  FHR reactive and reassuring.  She has been cleared by OB/GYN.  She is hemodynamically stable and in no acute distress.  She has a follow-up with OB/GYN within the next week.  Encouraged to keep this appointment.  ER return precautions given.  She is safe for discharge home with outpatient follow-up at this time.    Final Clinical Impression(s) / ED Diagnoses Final diagnoses:  Community acquired pneumonia of left lower lobe of lung    Rx / DC Orders ED Discharge Orders         Ordered    ondansetron (ZOFRAN ODT) 4 MG disintegrating tablet  Every 8 hours PRN     05/06/19 0526    azithromycin (ZITHROMAX) 250 MG tablet     05/06/19 0526    amoxicillin-clavulanate (AUGMENTIN) 875-125 MG tablet  Every 12 hours     05/06/19 0526           Frederik Pear A, PA-C 05/06/19 9629    Glynn Octave, MD 05/07/19 [phone]

## 2019-05-06 NOTE — Discharge Instructions (Addendum)
Thank you for allowing me to care for you today in the Emergency Department.   You tested negative for COVID-19 today.  You can take 650 milligrams of Tylenol once every 6 hours for body aches, fever, or headache.  For cough, dextromethorphan or guaifenesin are available over-the-counter and are safe to use while you are pregnant.  You can use 2 puffs of the albuterol inhaler with a spacer every 4 hours as needed for shortness of breath or wheezing.  Augmentin and azithromycin are antibiotics used to treat pneumonia in pregnancy.  Take as prescribed.  You can take Benadryl as directed on the label for itching and allergic symptoms.  Follow-up closely with your OB/GYN.  Return to the emergency department if you develop respiratory distress, high fevers that do not improve despite taking Tylenol, if you feel your baby starting to move last, or if you develop other new, concerning symptoms.

## 2019-05-06 NOTE — ED Notes (Signed)
Pt ambulated around ED pulse ox maintained 97%

## 2019-05-06 NOTE — Progress Notes (Signed)
Dr. Emelda Fear notified of fhr tracing, patient complaints. FHR reactive and reassuring. Cleared OB, ER MD to manage.

## 2019-05-08 ENCOUNTER — Ambulatory Visit (INDEPENDENT_AMBULATORY_CARE_PROVIDER_SITE_OTHER): Payer: Medicaid Other | Admitting: Advanced Practice Midwife

## 2019-05-08 ENCOUNTER — Other Ambulatory Visit (HOSPITAL_COMMUNITY)
Admission: RE | Admit: 2019-05-08 | Discharge: 2019-05-08 | Disposition: A | Discharge status: Home / Self Care | Payer: Medicaid Other | Source: Ambulatory Visit | Attending: Advanced Practice Midwife | Admitting: Advanced Practice Midwife

## 2019-05-08 ENCOUNTER — Other Ambulatory Visit: Payer: Self-pay

## 2019-05-08 VITALS — BP 109/68 | HR 95 | Wt 204.0 lb

## 2019-05-08 DIAGNOSIS — Z3483 Encounter for supervision of other normal pregnancy, third trimester: Secondary | ICD-10-CM

## 2019-05-08 DIAGNOSIS — Z3A36 36 weeks gestation of pregnancy: Secondary | ICD-10-CM

## 2019-05-08 DIAGNOSIS — Z331 Pregnant state, incidental: Secondary | ICD-10-CM

## 2019-05-08 DIAGNOSIS — Z1389 Encounter for screening for other disorder: Secondary | ICD-10-CM

## 2019-05-08 LAB — POCT URINALYSIS DIPSTICK OB
Blood, UA: NEGATIVE
Glucose, UA: NEGATIVE
Ketones, UA: NEGATIVE
Leukocytes, UA: NEGATIVE
Nitrite, UA: NEGATIVE

## 2019-05-08 NOTE — Progress Notes (Signed)
   LOW-RISK PREGNANCY VISIT Patient name: Michelle Larson MRN [id number]  Date of birth: 1995-12-14 Chief Complaint:   Routine Prenatal Visit (GBS,GC/CHL)  History of Present Illness:   Michelle Larson is a 24 y.o. [phone] female at [redacted]w[redacted]d with an Estimated Date of Delivery: 06/01/19 being seen today for ongoing management of a low-risk pregnancy.  Today she reports no complaints. Contractions: Not present. Vag. Bleeding: None.  Movement: Present. denies leaking of fluid. Review of Systems:   Pertinent items are noted in HPI Denies abnormal vaginal discharge w/ itching/odor/irritation, headaches, visual changes, shortness of breath, chest pain, abdominal pain, severe nausea/vomiting, or problems with urination or bowel movements unless otherwise stated above. Pertinent History Reviewed:  Reviewed past medical,surgical, social, obstetrical and family history.  Reviewed problem list, medications and allergies. Physical Assessment:   Vitals:   05/08/19 1529  BP: 109/68  Pulse: 95  Weight: 204 lb (92.5 kg)  Body mass index is 37.31 kg/m.        Physical Examination:   General appearance: Well appearing, and in no distress  Mental status: Alert, oriented to person, place, and time  Skin: Warm & dry  Cardiovascular: Normal heart rate noted  Respiratory: Normal respiratory effort, no distress  Abdomen: Soft, gravid, nontender  Pelvic: Cervical exam performed  Dilation: 2 Effacement (%): Thick Station: -2  Extremities: Edema: Trace  Fetal Status: Fetal Heart Rate (bpm): 151 Fundal Height: 36 cm Movement: Present Presentation: Vertex  Chaperone: Jobe Marker    Results for orders placed or performed in visit on 05/08/19 (from the past 24 hour(s))  POC Urinalysis Dipstick OB   Collection Time: 05/08/19  3:32 PM  Result Value Ref Range   Color, UA     Clarity, UA     Glucose, UA Negative Negative   Bilirubin, UA     Ketones, UA neg    Spec Grav, UA     Blood, UA neg    pH, UA     POC,PROTEIN,UA Trace Negative, Trace, Small (1+), Moderate (2+), Large (3+), 4+   Urobilinogen, UA     Nitrite, UA neg    Leukocytes, UA Negative Negative   Appearance     Odor      Assessment & Plan:  1) Low-risk pregnancy [id number] at [redacted]w[redacted]d with an Estimated Date of Delivery: 06/01/19   2) interested in waterbirth,  signed up to take class Wednesday.  Will sign consent form after class.    Meds: No orders of the defined types were placed in this encounter.  Labs/procedures today: GBS/GC/CHL  Plan:  Continue routine obstetrical care  Next visit: prefers in person    Reviewed: Term labor symptoms and general obstetric precautions including but not limited to vaginal bleeding, contractions, leaking of fluid and fetal movement were reviewed in detail with the patient.  All questions were answered. Has home bp cuff. Check bp weekly, let us know if >140/90.   Follow-up: Return in about 1 week (around 05/15/2019) for LROB.  Orders Placed This Encounter  Procedures  . Culture, beta strep (group b only)  . POC Urinalysis Dipstick OB   Jacklyn Shell DNP, CNM 05/08/2019 3:45 PM

## 2019-05-08 NOTE — Patient Instructions (Signed)

## 2019-05-10 LAB — CERVICOVAGINAL ANCILLARY ONLY
Chlamydia: NEGATIVE
Comment: NEGATIVE
Comment: NORMAL
Neisseria Gonorrhea: NEGATIVE

## 2019-05-12 LAB — CULTURE, BETA STREP (GROUP B ONLY): Strep Gp B Culture: NEGATIVE

## 2019-05-16 ENCOUNTER — Ambulatory Visit (INDEPENDENT_AMBULATORY_CARE_PROVIDER_SITE_OTHER): Payer: Medicaid Other | Admitting: Obstetrics and Gynecology

## 2019-05-16 ENCOUNTER — Encounter: Payer: Self-pay | Admitting: Obstetrics and Gynecology

## 2019-05-16 VITALS — BP 116/69 | HR 95 | Wt 210.0 lb

## 2019-05-16 DIAGNOSIS — Z3483 Encounter for supervision of other normal pregnancy, third trimester: Secondary | ICD-10-CM

## 2019-05-16 DIAGNOSIS — Z1389 Encounter for screening for other disorder: Secondary | ICD-10-CM

## 2019-05-16 DIAGNOSIS — Z3A37 37 weeks gestation of pregnancy: Secondary | ICD-10-CM

## 2019-05-16 DIAGNOSIS — Z331 Pregnant state, incidental: Secondary | ICD-10-CM

## 2019-05-16 LAB — POCT URINALYSIS DIPSTICK OB
Blood, UA: NEGATIVE
Glucose, UA: NEGATIVE
Ketones, UA: NEGATIVE
Leukocytes, UA: NEGATIVE
Nitrite, UA: NEGATIVE
POC,PROTEIN,UA: NEGATIVE

## 2019-05-16 NOTE — Progress Notes (Signed)
PATIENT ID: Michelle Larson, female     DOB: March 15, 1995, 24 y.o.     MRN: [id number]    LOW-RISK PREGNANCY VISIT PATIENT NAME: Michelle Larson MRN [id number]  DOB: [birth year]  Chief Complaint:   Routine Prenatal Visit  History of Present Illness:   Michelle Larson is a 24 y.o. [phone] female at [redacted]w[redacted]d with an Estimated Date of Delivery: 06/01/19 being seen today for ongoing management of a low-risk pregnancy.   Depression screen PHQ 2/9 12/07/2018  Decreased Interest 0  Down, Depressed, Hopeless 0  PHQ - 2 Score 0   Today she reports no complaints. Contractions: Not present. Vag. Bleeding: None.  Movement: Present. denies leaking of fluid.  Review of Systems:   Pertinent items are noted in HPI Denies abnormal vaginal discharge w/ itching/odor/irritation, headaches, visual changes, shortness of breath, chest pain, abdominal pain, severe nausea/vomiting, or problems with urination or bowel movements unless otherwise stated above.  Pertinent History Reviewed:  Reviewed past medical,surgical, social, obstetrical and family history.  Reviewed problem list, medications and allergies.  Physical Assessment:   Vitals:   05/16/19 1535  BP: 116/69  Pulse: 95  Weight: 210 lb (95.3 kg)  Body mass index is 38.41 kg/m.        Physical Examination:   General appearance: Well appearing, and in no distress  Mental status: Alert, oriented to person, place, and time  Skin: Warm & dry  Cardiovascular: Normal heart rate noted  Respiratory: Normal respiratory effort, no distress  Abdomen: Soft, gravid, nontender   Baby: 145bpm   37 cm  Pelvic: Cervical exam deferred         Extremities: Edema: Trace  Fetal Status:     Movement: Present    Chaperone: Data processing manager    Results for orders placed or performed in visit on 05/16/19 (from the past 24 hour(s))  POC Urinalysis Dipstick OB   Collection Time: 05/16/19  3:46 PM  Result Value Ref Range   Color, UA     Clarity, UA     Glucose, UA Negative Negative   Bilirubin, UA     Ketones, UA neg    Spec Grav, UA     Blood, UA neg    pH, UA     POC,PROTEIN,UA Negative Negative, Trace, Small (1+), Moderate (2+), Large (3+), 4+   Urobilinogen, UA     Nitrite, UA neg    Leukocytes, UA Negative Negative   Appearance     Odor      Assessment & Plan:  1) Low-risk pregnancy [id number] at [redacted]w[redacted]d with an Estimated Date of Delivery: 06/01/19   Meds:  No changes Labs/procedures today: u/a  Plan:  Continue routine obstetrical care ,  Next visit: in person next week    Reviewed: Term labor symptoms and general obstetric precautions including but not limited to vaginal bleeding, contractions, leaking of fluid and fetal movement were reviewed in detail with the patient.  All questions were answered. Check bp weekly, let us know if >140/90.   Follow-up: No follow-ups on file.   By signing my name below, I, [employer], attest that this documentation has been prepared under the direction and in the presence of Tilda Burrow, MD. Electronically Signed: Mal Misty Medical Scribe. 05/16/19. 4:05 PM.  I personally performed the services described in this documentation, which was SCRIBED in my presence. The recorded information has been reviewed and considered accurate. It has been edited as necessary during review. Tilda Burrow, MD

## 2019-05-23 ENCOUNTER — Encounter: Payer: Self-pay | Admitting: Women's Health

## 2019-05-23 ENCOUNTER — Other Ambulatory Visit: Payer: Self-pay

## 2019-05-23 ENCOUNTER — Telehealth (INDEPENDENT_AMBULATORY_CARE_PROVIDER_SITE_OTHER): Payer: Medicaid Other | Admitting: Women's Health

## 2019-05-23 VITALS — BP 119/65 | HR 88

## 2019-05-23 DIAGNOSIS — Z3483 Encounter for supervision of other normal pregnancy, third trimester: Secondary | ICD-10-CM

## 2019-05-23 NOTE — Progress Notes (Signed)
   TELEHEALTH VIRTUAL OBSTETRICS VISIT ENCOUNTER NOTE Patient name: Michelle Larson MRN [id number]  Date of birth: 11/10/1995  I connected with patient on 05/23/19 at  4:10 PM EDT by MyChart video  and verified that I am speaking with the correct person using two identifiers. Due to COVID-19 recommendations, pt is not currently in our office.    I discussed the limitations, risks, security and privacy concerns of performing an evaluation and management service by telephone and the availability of in person appointments. I also discussed with the patient that there may be a patient responsible charge related to this service. The patient expressed understanding and agreed to proceed.  Chief Complaint:   Routine Prenatal Visit  History of Present Illness:   Michelle Larson is a 24 y.o. [phone] female at [redacted]w[redacted]d with an Estimated Date of Delivery: 25/27/21 being evaluated today for ongoing management of a low-risk pregnancy.  Depression screen PHQ 2/9 12/07/2018  Decreased Interest 0  Down, Depressed, Hopeless 0  PHQ - 2 Score 0    Today she reports no complaints. Contractions: Irritability. Vag. Bleeding: None.  Movement: Present. denies leaking of fluid. Review of Systems:   Pertinent items are noted in HPI Denies abnormal vaginal discharge w/ itching/odor/irritation, headaches, visual changes, shortness of breath, chest pain, abdominal pain, severe nausea/vomiting, or problems with urination or bowel movements unless otherwise stated above. Pertinent History Reviewed:  Reviewed past medical,surgical, social, obstetrical and family history.  Reviewed problem list, medications and allergies. Physical Assessment:   Vitals:   05/23/19 1601  BP: 119/65  Pulse: 88  There is no height or weight on file to calculate BMI.        Physical Examination:   General:  Alert, oriented and cooperative.   Mental Status: Normal mood and affect perceived. Normal judgment and thought content.  Rest  of physical exam deferred due to type of encounter  No results found for this or any previous visit (from the past 24 hour(s)).  Assessment & Plan:  1) Pregnancy [id number] at [redacted]w[redacted]d with an Estimated Date of Delivery: 25/27/21   2) Interested in Systems developer, has attended class and signed consent   Meds: No orders of the defined types were placed in this encounter.   Labs/procedures today: none  Plan:  Continue routine obstetrical care.  Has home bp cuff.  Check bp weekly, let us know if >140/90.  Next visit: prefers in person, wants membranes swept  Reviewed: Term labor symptoms and general obstetric precautions including but not limited to vaginal bleeding, contractions, leaking of fluid and fetal movement were reviewed in detail with the patient. The patient was advised to call back or seek an in-person office evaluation/go to [facility] at [facility] for any urgent or concerning symptoms. All questions were answered. Please refer to After Visit Summary for other counseling recommendations.    I provided 15 minutes of non-face-to-face time during this encounter.  Follow-up: Return in about 1 week (around 05/30/2019) for CNM, in person, LROB.  No orders of the defined types were placed in this encounter.  Cheral Marker CNM, [facility] 05/23/2019 4:10 PM

## 2019-05-23 NOTE — Patient Instructions (Signed)
[facility], I greatly value your feedback.  If you receive a survey following your visit with [place] today, we appreciate you taking the time to fill it out.  Thanks, Joellyn Haff, CNM, WHNP-BC  [facility] ([address] [place], [place] [address]) Entrance C, located off of [address] valet parking   Go to [email or web address] to register for FREE online childbirth classes    Call the office [phone]) or go to [facility] if:  You begin to have strong, frequent contractions  Your water breaks.  Sometimes it is a big gush of fluid, sometimes it is just a trickle that keeps getting your panties wet or running down your legs  You have vaginal bleeding.  It is normal to have a small amount of spotting if your cervix was checked.   You don't feel your baby moving like normal.  If you don't, get you something to eat and drink and lay down and focus on feeling your baby move.  You should feel at least 10 movements in 2 hours.  If you don't, you should call the office or go to [facility].   Call the office [phone]) or go to [facility] hospital for these signs of pre-eclampsia:  Severe headache that does not go away with Tylenol  Visual changes- seeing spots, double, blurred vision  Pain under your right breast or upper abdomen that does not go away with Tums or heartburn medicine  Nausea and/or vomiting  Severe swelling in your hands, feet, and face    Home Blood Pressure Monitoring for Patients   Your provider has recommended that you check your blood pressure (BP) at least once a week at home. If you do not have a blood pressure cuff at home, one will be provided for you. Contact your provider if you have not received your monitor within 1 week.   Helpful Tips for Accurate Home Blood Pressure Checks  . Don't smoke, exercise, or drink caffeine 30 minutes before checking your BP . Use the restroom before checking your BP (a full  bladder can raise your pressure) . Relax in a comfortable upright chair . Feet on the ground . Left arm resting comfortably on a flat surface at the level of your heart . Legs uncrossed . Back supported . Sit quietly and don't talk . Place the cuff on your bare arm . Adjust snuggly, so that only two fingertips can fit between your skin and the top of the cuff . Check 2 readings separated by at least one minute . Keep a log of your BP readings . For a visual, please reference this diagram: http://ccnc.care/bpdiagram  Provider Name: [facility]     Phone: [phone]  Zone 1: ALL CLEAR  Continue to monitor your symptoms:  . BP reading is less than 140 (top number) or less than 90 (bottom number)  . No right upper stomach pain . No headaches or seeing spots . No feeling nauseated or throwing up . No swelling in face and hands  Zone 2: CAUTION Call your doctor's office for any of the following:  . BP reading is greater than 140 (top number) or greater than 90 (bottom number)  . Stomach pain under your ribs in the middle or right side . Headaches or seeing spots . Feeling nauseated or throwing up . Swelling in face and hands  Zone 3: EMERGENCY  Seek immediate medical care if you have any of the  following:  . BP reading is greater than160 (top number) or greater than 110 (bottom number) . Severe headaches not improving with Tylenol . Serious difficulty catching your breath . Any worsening symptoms from Zone 2   Braxton Hicks Contractions Contractions of the uterus can occur throughout pregnancy, but they are not always a sign that you are in labor. You may have practice contractions called Braxton Hicks contractions. These false labor contractions are sometimes confused with true labor. What are [place] contractions? Braxton Hicks contractions are tightening movements that occur in the muscles of the uterus before labor. Unlike true labor contractions, these  contractions do not result in opening (dilation) and thinning of the cervix. Toward the end of pregnancy (32-34 weeks), Braxton Hicks contractions can happen more often and may become stronger. These contractions are sometimes difficult to tell apart from true labor because they can be very uncomfortable. You should not feel embarrassed if you go to the hospital with false labor. Sometimes, the only way to tell if you are in true labor is for your health care provider to look for changes in the cervix. The health care provider will do a physical exam and may monitor your contractions. If you are not in true labor, the exam should show that your cervix is not dilating and your water has not broken. If there are no other health problems associated with your pregnancy, it is completely safe for you to be sent home with false labor. You may continue to have Braxton Hicks contractions until you go into true labor. How to tell the difference between true labor and false labor True labor  Contractions last 30-70 seconds.  Contractions become very regular.  Discomfort is usually felt in the top of the uterus, and it spreads to the lower abdomen and low back.  Contractions do not go away with walking.  Contractions usually become more intense and increase in frequency.  The cervix dilates and gets thinner. False labor  Contractions are usually shorter and not as strong as true labor contractions.  Contractions are usually irregular.  Contractions are often felt in the front of the lower abdomen and in the groin.  Contractions may go away when you walk around or change positions while lying down.  Contractions get weaker and are shorter-lasting as time goes on.  The cervix usually does not dilate or become thin. Follow these instructions at home:  1. Take over-the-counter and prescription medicines only as told by your health care provider. 2. Keep up with your usual exercises and follow other  instructions from your health care provider. 3. Eat and drink lightly if you think you are going into labor. 4. If Braxton Hicks contractions are making you uncomfortable: ? Change your position from lying down or resting to walking, or change from walking to resting. ? Sit and rest in a tub of warm water. ? Drink enough fluid to keep your urine pale yellow. Dehydration may cause these contractions. ? Do slow and deep breathing several times an hour. 5. Keep all follow-up prenatal visits as told by your health care provider. This is important. Contact a health care provider if:  You have a fever.  You have continuous pain in your abdomen. Get help right away if:  Your contractions become stronger, more regular, and closer together.  You have fluid leaking or gushing from your vagina.  You pass blood-tinged mucus (bloody show).  You have bleeding from your vagina.  You have low back pain  that you never had before.  You feel your baby's head pushing down and causing pelvic pressure.  Your baby is not moving inside you as much as it used to. Summary  Contractions that occur before labor are called Braxton Hicks contractions, false labor, or practice contractions.  Braxton Hicks contractions are usually shorter, weaker, farther apart, and less regular than true labor contractions. True labor contractions usually become progressively stronger and regular, and they become more frequent.  Manage discomfort from [facility] contractions by changing position, resting in a warm bath, drinking plenty of water, or practicing deep breathing. This information is not intended to replace advice given to you by your health care provider. Make sure you discuss any questions you have with your health care provider. Document Revised: 12/04/2016 Document Reviewed: 05/07/2016 Elsevier Patient Education  [place].

## 2019-05-30 ENCOUNTER — Other Ambulatory Visit: Payer: Self-pay

## 2019-05-30 ENCOUNTER — Encounter: Payer: Self-pay | Admitting: Obstetrics & Gynecology

## 2019-05-30 ENCOUNTER — Encounter (HOSPITAL_COMMUNITY): Payer: Self-pay | Admitting: Family Medicine

## 2019-05-30 ENCOUNTER — Ambulatory Visit (INDEPENDENT_AMBULATORY_CARE_PROVIDER_SITE_OTHER): Payer: Medicaid Other | Admitting: Obstetrics & Gynecology

## 2019-05-30 ENCOUNTER — Inpatient Hospital Stay (HOSPITAL_COMMUNITY)
Admission: AD | Admit: 2019-05-30 | Discharge: 2019-06-01 | DRG: 807 | Disposition: A | Discharge status: Home / Self Care | Payer: Medicaid Other | Attending: Family Medicine | Admitting: Family Medicine

## 2019-05-30 ENCOUNTER — Encounter: Payer: Medicaid Other | Admitting: Obstetrics & Gynecology

## 2019-05-30 VITALS — BP 114/62 | HR 83 | Wt 212.0 lb

## 2019-05-30 DIAGNOSIS — Z3A39 39 weeks gestation of pregnancy: Secondary | ICD-10-CM | POA: Diagnosis not present

## 2019-05-30 DIAGNOSIS — Z1389 Encounter for screening for other disorder: Secondary | ICD-10-CM

## 2019-05-30 DIAGNOSIS — Z3483 Encounter for supervision of other normal pregnancy, third trimester: Secondary | ICD-10-CM

## 2019-05-30 DIAGNOSIS — Z20822 Contact with and (suspected) exposure to covid-19: Secondary | ICD-10-CM | POA: Diagnosis present

## 2019-05-30 DIAGNOSIS — Z331 Pregnant state, incidental: Secondary | ICD-10-CM

## 2019-05-30 DIAGNOSIS — O26893 Other specified pregnancy related conditions, third trimester: Secondary | ICD-10-CM | POA: Diagnosis present

## 2019-05-30 LAB — CBC
HCT: 33.2 % — ABNORMAL LOW (ref 36.0–46.0)
Hemoglobin: 10.4 g/dL — ABNORMAL LOW (ref 12.0–15.0)
MCH: 25.4 pg — ABNORMAL LOW (ref 26.0–34.0)
MCHC: 31.3 g/dL (ref 30.0–36.0)
MCV: 81.2 fL (ref 80.0–100.0)
Platelets: 291 10*3/uL (ref 150–400)
RBC: 4.09 MIL/uL (ref 3.87–5.11)
RDW: 16.5 % — ABNORMAL HIGH (ref 11.5–15.5)
WBC: 13.1 10*3/uL — ABNORMAL HIGH (ref 4.0–10.5)
nRBC: 0.2 % (ref 0.0–0.2)

## 2019-05-30 LAB — TYPE AND SCREEN
ABO/RH(D): O POS
Antibody Screen: NEGATIVE

## 2019-05-30 LAB — ABO/RH: ABO/RH(D): O POS

## 2019-05-30 LAB — SARS CORONAVIRUS 2 BY RT PCR (HOSPITAL ORDER, PERFORMED IN ~~LOC~~ HOSPITAL LAB): SARS Coronavirus 2: NEGATIVE

## 2019-05-30 MED ORDER — ONDANSETRON HCL 4 MG/2ML IJ SOLN: 4.0000 mg | Freq: Four times a day (QID) | INTRAMUSCULAR | Status: DC | PRN

## 2019-05-30 MED ORDER — LACTATED RINGERS IV SOLN: 500.0000 mL | INTRAVENOUS | Status: DC | PRN

## 2019-05-30 MED ORDER — OXYTOCIN BOLUS FROM INFUSION
500.0000 mL | Freq: Once | INTRAVENOUS | Status: AC
  Administered 2019-05-31: 500 mL via INTRAVENOUS

## 2019-05-30 MED ORDER — LACTATED RINGERS IV SOLN: INTRAVENOUS | Status: DC

## 2019-05-30 MED ORDER — OXYTOCIN 40 UNITS IN NORMAL SALINE INFUSION - SIMPLE MED
2.5000 [IU]/h | INTRAVENOUS | Status: DC
  Filled 2019-05-30: qty 1000

## 2019-05-30 MED ORDER — LIDOCAINE HCL (PF) 1 % IJ SOLN: 30.0000 mL | INTRAMUSCULAR | Status: DC | PRN

## 2019-05-30 MED ORDER — SOD CITRATE-CITRIC ACID 500-334 MG/5ML PO SOLN: 30.0000 mL | ORAL | Status: DC | PRN

## 2019-05-30 MED ORDER — ACETAMINOPHEN 325 MG PO TABS: 650.0000 mg | ORAL_TABLET | ORAL | Status: DC | PRN

## 2019-05-30 MED ORDER — OXYCODONE-ACETAMINOPHEN 5-325 MG PO TABS: 1.0000 | ORAL_TABLET | ORAL | Status: DC | PRN

## 2019-05-30 MED ORDER — OXYCODONE-ACETAMINOPHEN 5-325 MG PO TABS: 2.0000 | ORAL_TABLET | ORAL | Status: DC | PRN

## 2019-05-30 NOTE — Progress Notes (Signed)
   LOW-RISK PREGNANCY VISIT Patient name: Michelle Larson MRN [id number]  Date of birth: [birth year] Chief Complaint:   Routine Prenatal Visit  History of Present Illness:   Michelle Larson is a 24 y.o. [phone] female at [redacted]w[redacted]d with an Estimated Date of Delivery: 06/01/19 being seen today for ongoing management of a low-risk pregnancy.  Depression screen PHQ 2/9 12/07/2018  Decreased Interest 0  Down, Depressed, Hopeless 0  PHQ - 2 Score 0    Today she reports no complaints. Contractions: Not present. Vag. Bleeding: None.  Movement: Present. denies leaking of fluid. Review of Systems:   Pertinent items are noted in HPI Denies abnormal vaginal discharge w/ itching/odor/irritation, headaches, visual changes, shortness of breath, chest pain, abdominal pain, severe nausea/vomiting, or problems with urination or bowel movements unless otherwise stated above. Pertinent History Reviewed:  Reviewed past medical,surgical, social, obstetrical and family history.  Reviewed problem list, medications and allergies. Physical Assessment:   Vitals:   05/30/19 0904  BP: 114/62  Pulse: 83  Weight: 212 lb (96.2 kg)  Body mass index is 38.78 kg/m.        Physical Examination:   General appearance: Well appearing, and in no distress  Mental status: Alert, oriented to person, place, and time  Skin: Warm & dry  Cardiovascular: Normal heart rate noted  Respiratory: Normal respiratory effort, no distress  Abdomen: Soft, gravid, nontender  Pelvic: Cervical exam performed  Dilation: 3 Effacement (%): 50 Station: -2  Extremities: Edema: Trace  Fetal Status: Fetal Heart Rate (bpm): 144 Fundal Height: 40 cm Movement: Present Presentation: Vertex  Chaperone: Amanda Rash    No results found for this or any previous visit (from the past 24 hour(s)).  Assessment & Plan:  1) Low-risk pregnancy [id number] at [redacted]w[redacted]d with an Estimated Date of Delivery: 06/01/19   2) membranes swept today,    Meds: No  orders of the defined types were placed in this encounter.  Labs/procedures today:   Plan:  Continue routine obstetrical care  Next visit: prefers in person    Reviewed: Term labor symptoms and general obstetric precautions including but not limited to vaginal bleeding, contractions, leaking of fluid and fetal movement were reviewed in detail with the patient.  All questions were answered. Has home bp cuff. Rx faxed to . Check bp weekly, let us know if >140/90.   Follow-up: Return in about 1 week (around 06/06/2019) for NST, LROB.  Orders Placed This Encounter  Procedures  . POC Urinalysis Dipstick OB   Lazaro Arms CNM, [facility] 05/30/2019 9:50 AM

## 2019-05-30 NOTE — H&P (Signed)
OBSTETRIC ADMISSION HISTORY AND PHYSICAL  Amanda Steuart is a 24 y.o. female [phone] with IUP at [redacted]w[redacted]d by LMP presenting for SOL. She reports +FMs, No LOF, no VB, no blurry vision, headaches or peripheral edema, and RUQ pain.  She plans on breast feeding. She requests withdrawal method for birth control. She received her prenatal care at [facility]   Dating: By LMP --->  Estimated Date of Delivery: 06/01/19  Sono: 12/30  [place] 18+6 wks,transverse head right,posterior placenta gr 0,cx 3.7 cm,normal ovaries,svp of fluid 5.2 cm,fhr 146 bpm,efw 279 g 64%,anatomy complete,no obvious abnormalities    Prenatal History/Complications: None  Past Medical History: History reviewed. No pertinent past medical history.  Past Surgical History: Past Surgical History:  Procedure Laterality Date  . CHOLECYSTECTOMY      Obstetrical History: OB History    Gravida  4   Para  2   Term  2   Preterm      AB  1   Living  2     SAB  1   TAB      Ectopic      Multiple      Live Births  2           Social History: Social History   Socioeconomic History  . Marital status: Married    Spouse name: Not on file  . Number of children: Not on file  . Years of education: Not on file  . Highest education level: Not on file  Occupational History  . Not on file  Tobacco Use  . Smoking status: Never Smoker  . Smokeless tobacco: Never Used  Substance and Sexual Activity  . Alcohol use: Not Currently  . Drug use: No  . Sexual activity: Yes    Birth control/protection: None  Other Topics Concern  . Not on file  Social History Narrative  . Not on file   Social Determinants of Health   Financial Resource Strain:   . Difficulty of Paying Living Expenses:   Food Insecurity:   . Worried About Charity fundraiser in the Last Year:   . Arboriculturist in the Last Year:   Transportation Needs:   . Film/video editor (Medical):   Marland Kitchen Lack of Transportation (Non-Medical):    Physical Activity:   . Days of Exercise per Week:   . Minutes of Exercise per Session:   Stress:   . Feeling of Stress :   Social Connections:   . Frequency of Communication with Friends and Family:   . Frequency of Social Gatherings with Friends and Family:   . Attends Religious Services:   . Active Member of Clubs or Organizations:   . Attends Archivist Meetings:   Marland Kitchen Marital Status:     Family History: Family History  Problem Relation Age of Onset  . Diabetes Mother   . Hyperlipidemia Maternal Grandmother   . Diabetes Maternal Grandfather     Allergies: No Known Allergies  Medications Prior to Admission  Medication Sig Dispense Refill Last Dose  . Blood Pressure Monitoring (BLOOD PRESSURE CUFF) MISC 1 Device by Does not apply route once a week. 1 each 0 05/30/2019 at Unknown time  . Prenatal Vit-Fe Fumarate-FA (PRENATAL MULTIVITAMIN) TABS tablet Take 1 tablet by mouth daily.   05/29/2019 at Unknown time  . Doxylamine-Pyridoxine 10-10 MG TBEC Take 2 po q hs and may take 1 in the morning and then 1 in the afternoon (Patient not taking: Reported  on 05/30/2019) 120 tablet 3   . ondansetron (ZOFRAN ODT) 4 MG disintegrating tablet Take 1 tablet (4 mg total) by mouth every 8 (eight) hours as needed for nausea or vomiting. (Patient not taking: Reported on 05/30/2019) 30 tablet 1      Review of Systems   All systems reviewed and negative except as stated in HPI  Blood pressure 118/77, pulse (!) 110, temperature 99 F (37.2 C), resp. rate 17, weight 95.7 kg, last menstrual period 08/25/2018. General appearance: alert, cooperative and appears stated age Lungs: normal effort Heart: regular rate  Abdomen: soft, non-tender; bowel sounds normal Pelvic: gravid uterus Extremities: Homans sign is negative, no sign of DVT Presentation: cephalic Fetal monitoringBaseline: 145 bpm, Variability: Good {> 6 bpm), Accelerations: Reactive and Decelerations: Absent Uterine activity:  Frequency: Every 5 minutes, Duration: 60 seconds and Intensity: strong Dilation: 6.5 Effacement (%): 90 Station: -1 Exam by:: Ralene Muskrat, RN   Prenatal labs: ABO, Rh: O/Positive/-- (12/02 1553) Antibody: Negative (02/26 0840) Rubella: 1.49 (12/02 1553) RPR: Non Reactive (02/26 0840)  HBsAg: Negative (12/02 1553)  HIV: Non Reactive (02/26 0840)  GBS: Negative/-- (05/03 1655)  2 hr Glucola WNL Genetic screening  normal Anatomy US WNL  Prenatal Transfer Tool  Maternal Diabetes: No Genetic Screening: Normal Maternal Ultrasounds/Referrals: Normal Fetal Ultrasounds or other Referrals:  None Maternal Substance Abuse:  No Significant Maternal Medications:  None Significant Maternal Lab Results: Group B Strep negative  No results found for this or any previous visit (from the past 24 hour(s)).  Patient Active Problem List   Diagnosis Date Noted  . Supervision of normal pregnancy 12/07/2018    Assessment/Plan:  Bianey Tesoro is a 24 y.o. [id number] at [redacted]w[redacted]d here for SOL.   Desires waterbirth. Expectant management.  Admit to labor and delivery  Covid test pending, patient aware she can't get in tub until Covid test has resulted GBS negative Desires to not have IV during labor, will consider IM pit after delivery if needed Labor team aware, and CNM aware of water birth plans.   Thressa Sheller DNP, CNM  05/30/19  9:36 PM

## 2019-05-30 NOTE — MAU Note (Signed)
Patient reports CTX 5-10 mins apart.  Denies LOF.  Endorses + FM.  Had her membranes stripped in the office today and has had some bloody-mucousy discharge since then.  3.5 cm in the office today.  Denies complications w/ her pregnancy.

## 2019-05-31 ENCOUNTER — Encounter (HOSPITAL_COMMUNITY): Payer: Self-pay | Admitting: Family Medicine

## 2019-05-31 DIAGNOSIS — Z3A39 39 weeks gestation of pregnancy: Secondary | ICD-10-CM

## 2019-05-31 LAB — CBC
HCT: 31.9 % — ABNORMAL LOW (ref 36.0–46.0)
Hemoglobin: 9.9 g/dL — ABNORMAL LOW (ref 12.0–15.0)
MCH: 25.4 pg — ABNORMAL LOW (ref 26.0–34.0)
MCHC: 31 g/dL (ref 30.0–36.0)
MCV: 82 fL (ref 80.0–100.0)
Platelets: 254 10*3/uL (ref 150–400)
RBC: 3.89 MIL/uL (ref 3.87–5.11)
RDW: 16.6 % — ABNORMAL HIGH (ref 11.5–15.5)
WBC: 16.9 10*3/uL — ABNORMAL HIGH (ref 4.0–10.5)
nRBC: 0.1 % (ref 0.0–0.2)

## 2019-05-31 LAB — RPR: RPR Ser Ql: NONREACTIVE

## 2019-05-31 MED ORDER — DIBUCAINE (PERIANAL) 1 % EX OINT: 1.0000 "application " | TOPICAL_OINTMENT | CUTANEOUS | Status: DC | PRN

## 2019-05-31 MED ORDER — IBUPROFEN 600 MG PO TABS
600.0000 mg | ORAL_TABLET | Freq: Four times a day (QID) | ORAL | Status: DC
  Administered 2019-05-31 – 2019-06-01 (×6): 600 mg via ORAL
  Filled 2019-05-31 (×6): qty 1

## 2019-05-31 MED ORDER — MISOPROSTOL 200 MCG PO TABS: 800.0000 ug | ORAL_TABLET | Freq: Once | ORAL | Status: DC

## 2019-05-31 MED ORDER — ONDANSETRON HCL 4 MG PO TABS: 4.0000 mg | ORAL_TABLET | ORAL | Status: DC | PRN

## 2019-05-31 MED ORDER — DIPHENHYDRAMINE HCL 25 MG PO CAPS: 25.0000 mg | ORAL_CAPSULE | Freq: Four times a day (QID) | ORAL | Status: DC | PRN

## 2019-05-31 MED ORDER — ONDANSETRON HCL 4 MG/2ML IJ SOLN: 4.0000 mg | INTRAMUSCULAR | Status: DC | PRN

## 2019-05-31 MED ORDER — ACETAMINOPHEN 325 MG PO TABS
650.0000 mg | ORAL_TABLET | ORAL | Status: DC | PRN
  Administered 2019-05-31: 650 mg via ORAL
  Filled 2019-05-31: qty 2

## 2019-05-31 MED ORDER — OXYCODONE HCL 5 MG PO TABS: 10.0000 mg | ORAL_TABLET | ORAL | Status: DC | PRN

## 2019-05-31 MED ORDER — OXYCODONE HCL 5 MG PO TABS
5.0000 mg | ORAL_TABLET | ORAL | Status: DC | PRN
  Administered 2019-05-31: 5 mg via ORAL
  Filled 2019-05-31: qty 1

## 2019-05-31 MED ORDER — SIMETHICONE 80 MG PO CHEW: 80.0000 mg | CHEWABLE_TABLET | ORAL | Status: DC | PRN

## 2019-05-31 MED ORDER — WITCH HAZEL-GLYCERIN EX PADS: 1.0000 "application " | MEDICATED_PAD | CUTANEOUS | Status: DC | PRN

## 2019-05-31 MED ORDER — ZOLPIDEM TARTRATE 5 MG PO TABS: 5.0000 mg | ORAL_TABLET | Freq: Every evening | ORAL | Status: DC | PRN

## 2019-05-31 MED ORDER — COCONUT OIL OIL
1.0000 "application " | TOPICAL_OIL | Status: DC | PRN
  Administered 2019-05-31: 1 via TOPICAL

## 2019-05-31 MED ORDER — OXYTOCIN 10 UNIT/ML IJ SOLN
INTRAMUSCULAR | Status: AC
  Filled 2019-05-31: qty 1

## 2019-05-31 MED ORDER — BENZOCAINE-MENTHOL 20-0.5 % EX AERO: 1.0000 "application " | INHALATION_SPRAY | CUTANEOUS | Status: DC | PRN

## 2019-05-31 MED ORDER — SENNOSIDES-DOCUSATE SODIUM 8.6-50 MG PO TABS
2.0000 | ORAL_TABLET | ORAL | Status: DC
  Administered 2019-05-31: 2 via ORAL
  Filled 2019-05-31: qty 2

## 2019-05-31 MED ORDER — TRANEXAMIC ACID-NACL 1000-0.7 MG/100ML-% IV SOLN
INTRAVENOUS | Status: AC
  Administered 2019-05-31: 1000 mg
  Filled 2019-05-31: qty 100

## 2019-05-31 MED ORDER — OXYTOCIN 10 UNIT/ML IJ SOLN
10.0000 [IU] | Freq: Once | INTRAMUSCULAR | Status: AC
  Administered 2019-05-31: 10 [IU] via INTRAMUSCULAR

## 2019-05-31 MED ORDER — PRENATAL MULTIVITAMIN CH
1.0000 | ORAL_TABLET | Freq: Every day | ORAL | Status: DC
  Administered 2019-05-31 – 2019-06-01 (×2): 1 via ORAL
  Filled 2019-05-31 (×2): qty 1

## 2019-05-31 MED ORDER — TETANUS-DIPHTH-ACELL PERTUSSIS 5-2.5-18.5 LF-MCG/0.5 IM SUSP: 0.5000 mL | Freq: Once | INTRAMUSCULAR | Status: DC

## 2019-05-31 NOTE — Progress Notes (Signed)
Michelle Larson is a 24 y.o. [phone] at [redacted]w[redacted]d admitted for active labor  Subjective: Coping well in the waterbirth tub. Requesting AROM   Objective: BP 119/72 (BP Location: Left Arm)   Pulse (!) 105   Temp 98.4 F (36.9 C) (Oral)   Resp 18   Ht 5\' 2"  (1.575 m)   Wt 95.7 kg   LMP 08/25/2018 (Exact Date)   BMI 38.59 kg/m  No intake/output data recorded. No intake/output data recorded.  FHT:  FHR: 145 bpm, variability: moderate,  accelerations:  Present,  decelerations:  Absent patient on the monitor for about 5 mins while in bed for AROM otherwise intermittent auscultation  UC:   regular, every 3 minutes SVE:   Dilation: 8 Effacement (%): 100 Station: 0 Exam by:: Natnael Biederman, Cnm  Labs: Lab Results  Component Value Date   WBC 13.1 (H) 05/30/2019   HGB 10.4 (L) 05/30/2019   HCT 33.2 (L) 05/30/2019   MCV 81.2 05/30/2019   PLT 291 05/30/2019    Assessment / Plan: Spontaneous labor, progressing normally  Labor: Progressing normally and AROM for clear fluid  Preeclampsia:  NA Fetal Wellbeing:  Category I Pain Control:  Labor support without medications and Water tub I/D:  n/a Anticipated MOD:  NSVD and waterbirth   06/01/2019 DNP, CNM  05/31/19  1:12 AM

## 2019-05-31 NOTE — Lactation Note (Signed)
This note was copied from a baby's chart. Lactation Consultation Note  Patient Name: Michelle Larson [id number] Date: 05/31/2019 Reason for consult: Follow-up assessment;Term;Infant weight loss  Baby is 13 hours old  Baby awake and LC offered to assist with latch. LC checked diaper and changed a mec  Smear. Prior to latch - mom massaged, hand expressed , and pre-pumped with hand pump .  Baby latched with firm support and depth , flanged lips . Swallows increased with breast compressions and moist heat burp clothe. Per mom comfortable  Baby fed for 22 minutes and released on his own. Nipple well rounded when baby released.  LC reviewed breast feeding basics when LC was working with mom .  Mom mentioned her 1st 2 babies did not latch well and breast feeding fell apart.  LC reassured mom she is off to a good start breast feeding and this time it probably will be different.  Mom already has a hand pump and was using the #27 F and the LC moved it down to #24 F and it appeared to be a good fit and per mom comfortable.  The reason mom is pre-pumping is do to short shaft nipples.    Maternal Data Has patient been taught Hand Expression?: Yes  Feeding Feeding Type: Breast Fed  LATCH Score Latch: Grasps breast easily, tongue down, lips flanged, rhythmical sucking.  Audible Swallowing: Spontaneous and intermittent  Type of Nipple: Everted at rest and after stimulation(short shaft nipple - prepump)  Comfort (Breast/Nipple): Soft / non-tender  Hold (Positioning): Assistance needed to correctly position infant at breast and maintain latch.  LATCH Score: 9  Interventions Interventions: Breast feeding basics reviewed;Assisted with latch;Skin to skin;Breast massage;Hand express;Pre-pump if needed;Breast compression;Adjust position;Support pillows;Position options;Shells;Hand pump  Lactation Tools Discussed/Used Tools: Shells;Pump Shell Type: Inverted Breast pump type: Manual WIC  Program: Yes Pump Review: Milk Storage Initiated by:: reviewed Date initiated:: 05/31/19   Consult Status Consult Status: Follow-up Date: 06/01/19 Follow-up type: In-patient    Matilde Sprang Julanne Schlueter 05/31/2019, 2:36 PM

## 2019-05-31 NOTE — Lactation Note (Signed)
This note was copied from a baby's chart. Lactation Consultation Note  Patient Name: Michelle Larson [id number] Date: 05/31/2019 Reason for consult: Initial assessment;Term;Other (Comment)(LGA infant greater than 9 lbs) P3, 3 hour term LGA female infant. Per mom, she is active on the [facility] pr ogram in [facility] she doesn't have breast pump at home. Mom was given a hand pump by LC . Per mom, infant latched twice in L&D BF for 30 minutes, first latch was painful but she re-latched infant 2nd time and only felt a tug. LC did not observe latch at this time, infant BF less than 2 hour ago.  Per mom, she had latch difficulties with her previous 2 children and BF them briefly her 1st child only for 2 weeks and 2nd child was 3 weeks. LC discussed hand expression and mom taught back LC notice mom has short shaft nipples, LC gave mom hand pump to -pre-pump breast prior to latching infant at breast. Mom will offer infant EBM that is bullet ( ) after she latching infant to breast at next feeding. Mom given breast shells to wear in bra during the day to help evert nipple shaft out more. Mom knows to breastfeed infant according to hunger cues, 8 to 12 times within 24 hours, on demand and not to exceed 3 hours without breastfeeding infant. Mom knows to call RN or LC if she has any questions, concerns or need assistance with latching infant at breast.  Mom will continue to do STS as much as possible with infant. Mom made aware of O/P services, breastfeeding support groups, community resources, and our phone # for post-discharge questions.   Maternal Data Formula Feeding for Exclusion: No Has patient been taught Hand Expression?: Yes Does the patient have breastfeeding experience prior to this delivery?: Yes  Feeding Feeding Type: Breast Fed  LATCH Score Latch: Repeated attempts needed to sustain latch, nipple held in mouth throughout feeding, stimulation needed to elicit sucking  reflex.  Audible Swallowing: A few with stimulation  Type of Nipple: Everted at rest and after stimulation  Comfort (Breast/Nipple): Soft / non-tender  Hold (Positioning): No assistance needed to correctly position infant at breast.  LATCH Score: 8  Interventions Interventions: Breast feeding basics reviewed;Skin to skin;Hand express;Expressed milk;Pre-pump if needed;Hand pump;Shells  Lactation Tools Discussed/Used WIC Program: Yes Pump Review: Setup, frequency, and cleaning;Milk Storage Initiated by:: Danelle Earthly, IBCLC Date initiated:: 05/31/19   Consult Status Consult Status: Follow-up Date: 05/31/19 Follow-up type: In-patient    Danelle Earthly 05/31/2019, 4:40 AM

## 2019-05-31 NOTE — Discharge Summary (Addendum)
Postpartum Discharge Summary    Patient Name: Michelle Larson DOB: 1995-01-15 MRN: [id number]  Date of admission: 05/30/2019 Delivery date:05/31/2019  Delivering provider: Marcille Buffy D  Date of discharge: 06/01/2019  Admitting diagnosis: Normal labor [O80, Z37.9] Intrauterine pregnancy: [redacted]w[redacted]d    Secondary diagnosis:  Active Problems:   Normal labor  Additional problems: None    Discharge diagnosis: Term Pregnancy Delivered and PPH; delivered in tub                                            Post partum procedures:received IM Pit, buccal cytotec, and TXA after delivery for bleeding, hemostasis acheived; EBL 754cc Augmentation: AROM Complications: None  Hospital course: Onset of Labor With Vaginal Delivery      24y.o. yo [id number] admitted in Active Labor on 05/30/2019. Patient had an uncomplicated labor course, but required  as follows:  Membrane Rupture Time/Date: 1:05 AM ,05/31/2019   Delivery Method:Vaginal, Spontaneous  Episiotomy: None  Lacerations:  None  Patient had an uncomplicated postpartum course.  She is ambulating, tolerating a regular diet, passing flatus, and urinating well. Patient is discharged home in stable condition on 06/01/19 per her request for early d/c.  Newborn Data: Birth date:05/31/2019  Birth time:1:32 AM  Gender:Female  Living status:Living  Apgars:9 ,9  Weight:4281 g   Magnesium Sulfate received: No BMZ received: No Rhophylac:N/A MMR:N/A T-DaP:Given prenatally 03/03/19 Flu: Yes 03/03/19 Transfusion:No  Physical exam  Vitals:   05/31/19 1130 05/31/19 1519 05/31/19 1922 06/01/19 0540  BP: 117/66 (!) 110/54 112/69 124/63  Pulse: 77 75 68 74  Resp: '18 18 17 16  '$ Temp: 98.4 F (36.9 C) 98.2 F (36.8 C) 98.4 F (36.9 C) 98.6 F (37 C)  TempSrc: Oral Oral Oral Oral  SpO2:  99% 100% 100%  Weight:      Height:       Physical Exam completed by AlNile DearMedical Student  General: alert, cooperative and no  distress Lochia:normal flow Chest: no increased WOB Heart: regular rate Abdomen: soft, nontender, fundus firm at/below umbilicus Uterine Fundus: firm DVT Evaluation: No evidence of DVT seen on physical exam. Extremities: slight trace edema  Labs: Lab Results  Component Value Date   WBC 16.9 (H) 05/31/2019   HGB 9.9 (L) 05/31/2019   HCT 31.9 (L) 05/31/2019   MCV 82.0 05/31/2019   PLT 254 05/31/2019   CMP Latest Ref Rng & Units 05/19/2018  Glucose 70 - 99 mg/dL 81  BUN 6 - 20 mg/dL 11  Creatinine 0.44 - 1.00 mg/dL 0.48  Sodium 135 - 145 mmol/L 137  Potassium 3.5 - 5.1 mmol/L 3.4(L)  Chloride 98 - 111 mmol/L 105  CO2 22 - 32 mmol/L 23  Calcium 8.9 - 10.3 mg/dL 8.9  Total Protein 6.5 - 8.1 g/dL 7.6  Total Bilirubin 0.3 - 1.2 mg/dL 0.6  Alkaline Phos 38 - 126 U/L 64  AST 15 - 41 U/L 16  ALT 0 - 44 U/L 24   Edinburgh Score: Edinburgh Postnatal Depression Scale Screening Tool 05/31/2019  I have been able to laugh and see the funny side of things. (No Data)     After visit meds:  Allergies as of 06/01/2019   No Known Allergies     Medication List    STOP taking these medications   Doxylamine-Pyridoxine 10-10 MG Tbec  TAKE these medications   Blood Pressure Cuff Misc 1 Device by Does not apply route once a week.   ibuprofen 600 MG tablet Commonly known as: ADVIL Take 1 tablet (600 mg total) by mouth every 6 (six) hours.   norethindrone 0.35 MG tablet Commonly known as: Ortho Micronor Take 1 tablet (0.35 mg total) by mouth daily.   ondansetron 4 MG disintegrating tablet Commonly known as: Zofran ODT Take 1 tablet (4 mg total) by mouth every 8 (eight) hours as needed for nausea or vomiting.   prenatal multivitamin Tabs tablet Take 1 tablet by mouth daily.        Discharge home in stable condition Infant Feeding: Bottle and Breast Infant Disposition:home with mother Discharge instruction: per After Visit Summary and Postpartum booklet. Activity:  Advance as tolerated. Pelvic rest for 6 weeks.  Diet: routine diet Future Appointments: Future Appointments  Date Time Provider [place]  07/06/2019 11:50 AM Cresenzo-Dishmon, Joaquim Lai, CNM [facility]   Follow up Visit:   Please schedule this patient for a Virtual postpartum visit in 6 weeks with the following provider: Any provider. Additional Postpartum F/U:None   Low risk pregnancy complicated by: none  Delivery mode:  Vaginal, Spontaneous   Waterbirth  Anticipated Birth Control:  withdrawl    06/01/2019 Myrtis Ser CNM

## 2019-06-01 ENCOUNTER — Inpatient Hospital Stay (HOSPITAL_COMMUNITY): Admission: RE | Admit: 2019-06-01 | Payer: Medicaid Other | Source: Home / Self Care

## 2019-06-01 MED ORDER — IBUPROFEN 600 MG PO TABS: 600.0000 mg | ORAL_TABLET | Freq: Four times a day (QID) | ORAL | 0 refills | Status: DC

## 2019-06-01 MED ORDER — NORETHINDRONE 0.35 MG PO TABS
1.0000 | ORAL_TABLET | Freq: Every day | ORAL | 11 refills | Status: DC
Start: 2019-06-01 — End: 2019-07-06

## 2019-06-01 NOTE — Discharge Instructions (Signed)

## 2019-06-01 NOTE — Progress Notes (Signed)
Post Partum Day 1  Subjective:  Michelle Larson is a 24 y.o. [id number] [redacted]w[redacted]d s/p VD.  No acute events overnight.  Pt denies problems with ambulating, voiding or po intake.  She denies nausea or vomiting.  Pain is well controlled with exception of tender nipples.  She has had flatus. She has not had bowel movement.  Lochia Small.  Plan for birth control is no method.  Method of Feeding: both breast and bottle  Objective: BP 124/63 (BP Location: Right Arm)   Pulse 74   Temp 98.6 F (37 C) (Oral)   Resp 16   Ht 5\' 2"  (1.575 m)   Wt 95.7 kg   LMP 08/25/2018 (Exact Date)   SpO2 100%   Breastfeeding Unknown   BMI 38.59 kg/m   Physical Exam:  General: alert, cooperative and no distress Lochia:normal flow Chest: no increased WOB Heart: regular rate Abdomen: soft, nontender, fundus firm at/below umbilicus Uterine Fundus: firm DVT Evaluation: No evidence of DVT seen on physical exam. Extremities: slight trace edema  Recent Labs    05/30/19 2114 05/31/19 0515  HGB 10.4* 9.9*  HCT 33.2* 31.9*    Assessment/Plan:  ASSESSMENT: Michelle Larson is a 24 y.o. 22 [redacted]w[redacted]d ppd #1 s/p NSVD doing well.   Discharge home and Contraception - would like more info on nonhormonal options   LOS: 2 days   Michelle Larson 06/01/2019, 7:20 AM

## 2019-06-01 NOTE — Lactation Note (Signed)
This note was copied from a baby's chart. Lactation Consultation Note Baby 25 hrs old. Mom requesting NS. Mom stated she wore NS w/her other children that she BF for about 1 month. Mom has very short shaft nipples. Red/bruised. Mom states has blisters. Mom has coconut oil, encouraged her to use it.  Gave mom comfort gels, instructed not to use when she is wearing coconut oil.  Noted baby has very thick labial frenulum and tongue curls when cries, lays low, can't extend tongue past lips. Has a little bit of high palate. Gave mom information resources to have baby assessed. Encouraged mom to ask Peds. MD.  Encouraged mom to wear shells to evert nipple more and also help w/soreness.  LC fitted mom w/#20 NS. Demonstrated application. Mom shown how to use DEBP & how to disassemble, clean, & reassemble parts. Mom knows to pump q3h for 15-20 min. Mom pumping when LC left.  Mom doesn't feel that baby was getting enough milk from her. Mom's breast are filling. Encouraged to pump then hand express/   Patient Name: Michelle Larson [id number]'L Date: 06/01/2019 Reason for consult: Mother's request;Nipple pain/trauma   Maternal Data    Feeding Feeding Type: Formula Nipple Type: Slow - flow  LATCH Score       Type of Nipple: Everted at rest and after stimulation(short shaft/)  Comfort (Breast/Nipple): Filling, red/small blisters or bruises, mild/mod discomfort        Interventions Interventions: Breast feeding basics reviewed;Position options;Breast massage;Hand express;Coconut oil;Shells;Pre-pump if needed;DEBP;Hand pump;Breast compression;Support pillows  Lactation Tools Discussed/Used Tools: Pump;Shells;Coconut oil;Comfort gels;Nipple Shields Nipple shield size: 20 Shell Type: Inverted Breast pump type: Double-Electric Breast Pump Pump Review: Setup, frequency, and cleaning;Milk Storage Initiated by:: Peri Jefferson RN IBCLC Date initiated:: 06/01/19   Consult  Status Consult Status: Follow-up Date: 06/01/19 Follow-up type: In-patient    Charyl Dancer 06/01/2019, 6:41 AM

## 2019-06-06 ENCOUNTER — Other Ambulatory Visit: Payer: Medicaid Other | Admitting: Obstetrics & Gynecology

## 2019-07-06 ENCOUNTER — Ambulatory Visit (INDEPENDENT_AMBULATORY_CARE_PROVIDER_SITE_OTHER): Payer: Medicaid Other | Admitting: Advanced Practice Midwife

## 2019-07-06 ENCOUNTER — Other Ambulatory Visit: Payer: Self-pay

## 2019-07-06 ENCOUNTER — Encounter: Payer: Self-pay | Admitting: Advanced Practice Midwife

## 2019-07-06 NOTE — Progress Notes (Signed)
Post Partum Visit Note  Michelle Larson is a 24 y.o. [phone] female who presents for a postpartum visit. She is 5 weeks postpartum following a normal spontaneous vaginal delivery, waterbirth.  I have fully reviewed the prenatal and intrapartum ourse. The delivery was at 39.6 gestational weeks.  Anesthesia: none. Postpartum course has been fine.  She had an immediate PPH,, hgb to 9.9 . Baby is doing well. Baby is feeding by both breast and bottle - gerber smooth. Bleeding period started Monday. Bowel function is normal. Bladder function is normal. Patient is not sexually active. Contraception method is coitus interruptus. Postpartum depression screening: negative.    Review of Systems Pertinent items are noted in HPI.   Objective:  Blood pressure 112/71, pulse 67, height 5\' 2"  (1.575 m), weight 190 lb (86.2 kg), currently breastfeeding.  General:  alert, cooperative, and no distress   Breasts:  negative  Lungs: Normal respiratory effort  Heart:  regular rate and rhythm  Abdomen: soft, non-tender; bowel sounds normal; no masses   Vulva:  normal  Vagina: normal vagina  Cervix:  normal  Corpus: Well involuted  Adnexa:  not evaluated  Rectal Exam: no hemorrhoids        Assessment:    normal postpartum exam. Pap smear not done at today's visit.   Plan:   Essential components of care per ACOG recommendations:  1.  Mood and well being: Patient with negative depression screening today. Reviewed local resources for support.  - Patient does not use tobacco. If using tobacco we discussed reduction and for recently cessation risk of relapse - hx of drug use? No   If yes, discussed support systems  2. Infant care and feeding:  -Patient currently breastmilk feeding? Yes If breastmilk feeding discussed return to work and pumping. If needed, patient was provided letter for work to allow for every 2-3 hr pumping breaks, and to be granted a private location to express breastmilk and  refrigerated area to store breastmilk. Reviewed importance of draining breast regularly to support lactation. -Social determinants of health (SDOH) reviewed in EPIC. No concernsThe following needs were identifiednone  3. Sexuality, contraception and birth spacing - Patient does not want a pregnancy in the next year.  Desired family size is 5 children.  - Reviewed forms of contraception in tiered fashion. Patient desired withdrawal method today.   - Discussed birth spacing of 18 months  4. Sleep and fatigue -Encouraged family/partner/community support of 4 hrs of uninterrupted sleep to help with mood and fatigue  5. Physical Recovery  - Discussed patients delivery and complications - Patient had a 0 degree laceration, perineal healing reviewed. Patient expressed understanding - Patient has urinary incontinence? No  - Patient is safe to resume physical and sexual activity  6.  Health Maintenance - Last pap smear done 2019 and was normal with negative HPV.  7. No Chronic Disease - PCP follow up  2020, CNM [facility]

## 2019-08-15 ENCOUNTER — Telehealth: Payer: Self-pay | Admitting: Obstetrics & Gynecology

## 2019-08-15 ENCOUNTER — Encounter: Payer: Self-pay | Admitting: Obstetrics & Gynecology

## 2019-08-15 NOTE — Telephone Encounter (Signed)
Available on mychart

## 2019-08-15 NOTE — Telephone Encounter (Signed)
Patient called stating that her employer is requesting a letter from here for her to be able to go back to work. Pt has delivered and was seen for her postpartum on 07/06/2019. Pt would like it faxed to [phone] ATTN: Alona Bene. Please contact pt when letter is faxed.

## 2019-08-15 NOTE — Telephone Encounter (Signed)
Telephoned patient at home number and patient states need letter able to return work. Patient started back 08/14/2019. Fine to send letter through mychart.

## 2019-08-30 ENCOUNTER — Ambulatory Visit: Payer: Medicaid Other | Admitting: Adult Health

## 2019-11-13 ENCOUNTER — Encounter: Payer: Self-pay | Admitting: Advanced Practice Midwife

## 2019-11-13 ENCOUNTER — Ambulatory Visit (INDEPENDENT_AMBULATORY_CARE_PROVIDER_SITE_OTHER): Payer: Medicaid Other | Admitting: Advanced Practice Midwife

## 2019-11-13 VITALS — BP 123/75 | HR 83

## 2019-11-13 DIAGNOSIS — N938 Other specified abnormal uterine and vaginal bleeding: Secondary | ICD-10-CM | POA: Diagnosis not present

## 2019-11-13 MED ORDER — NORETHINDRONE 0.35 MG PO TABS
1.0000 | ORAL_TABLET | Freq: Every day | ORAL | 11 refills | Status: DC
Start: 2019-11-13 — End: 2020-10-28

## 2019-11-13 NOTE — Patient Instructions (Signed)

## 2019-11-13 NOTE — Progress Notes (Signed)
   GYN VISIT Patient name: Michelle Larson MRN [id number]  Date of birth: 05-25-1995 Chief Complaint:   Vaginal Bleeding (having full period every 7 days)  History of Present Illness:   Michelle Larson is a 24 y.o. [phone]  female being seen today for irreg bldg. She is having 0-3 episodes of vag bldg in Aug & Oct (delivered in May 2021). Is breastfeeding solely (pumping).    Depression screen [facility] 2/9 12/07/2018  Decreased Interest 0  Down, Depressed, Hopeless 0  PHQ - 2 Score 0    Patient's last menstrual period was 11/04/2019. The current method of family planning is coitus interruptus.  Last pap 2019 from PCP. Results were:  normal per pt Review of Systems:   Pertinent items are noted in HPI Denies fever/chills, dizziness, headaches, visual disturbances, fatigue, shortness of breath, chest pain, abdominal pain, vomiting, abnormal vaginal discharge/itching/odor/irritation, problems with periods, bowel movements, urination, or intercourse unless otherwise stated above.  Pertinent History Reviewed:  Reviewed past medical,surgical, social, obstetrical and family history.  Reviewed problem list, medications and allergies. Physical Assessment:   Vitals:   11/13/19 1337  BP: 123/75  Pulse: 83  There is no height or weight on file to calculate BMI.       Physical Examination:   General appearance: alert, well appearing, and in no distress  Mental status: alert, oriented to person, place, and time  Skin: warm & dry   Cardiovascular: normal heart rate noted  Respiratory: normal respiratory effort, no distress  Abdomen: soft, non-tender   Pelvic: examination not indicated  Extremities: no edema    No results found for this or any previous visit (from the past 24 hour(s)).  Assessment & Plan:  1) DUB> breastfeeding from vag del May 2021; reviewed options of Depo, Nexplanon, IUD or POPs- pt elects POPs; instructed to start with next bleed, or no IC x 2wks then neg UPT; may take  3 months to regulate cycles, and may not end up being helpful; per her request, won't schedule f/u visit- she will call in 3 mos if bleeding pattern not improved   Meds:  Meds ordered this encounter  Medications  . norethindrone (MICRONOR) 0.35 MG tablet    Sig: Take 1 tablet (0.35 mg total) by mouth daily.    Dispense:  28 tablet    Refill:  11    Order Specific Question:   Supervising Provider    Answer:   Duane Lope H [[id number]]    No orders of the defined types were placed in this encounter.   Return in about 1 year (around 11/12/2020) for Pap & Physical.  Arabella Merles [facility] 11/13/2019 2:04 PM

## 2019-11-13 NOTE — Addendum Note (Signed)
Addended by: Cam Hai D on: 11/13/2019 03:10 PM   Modules accepted: Level of Service

## 2019-12-03 DIAGNOSIS — H5213 Myopia, bilateral: Secondary | ICD-10-CM | POA: Diagnosis not present

## 2020-01-22 ENCOUNTER — Ambulatory Visit
Admission: EM | Admit: 2020-01-22 | Discharge: 2020-01-22 | Disposition: A | Discharge status: Home / Self Care | Payer: Medicaid Other | Attending: Family Medicine | Admitting: Family Medicine

## 2020-01-22 ENCOUNTER — Encounter: Payer: Self-pay | Admitting: Emergency Medicine

## 2020-01-22 ENCOUNTER — Other Ambulatory Visit: Payer: Self-pay

## 2020-01-22 DIAGNOSIS — R000 Tachycardia, unspecified: Secondary | ICD-10-CM | POA: Diagnosis not present

## 2020-01-22 DIAGNOSIS — R509 Fever, unspecified: Secondary | ICD-10-CM

## 2020-01-22 DIAGNOSIS — J029 Acute pharyngitis, unspecified: Secondary | ICD-10-CM | POA: Diagnosis not present

## 2020-01-22 DIAGNOSIS — R6883 Chills (without fever): Secondary | ICD-10-CM

## 2020-01-22 DIAGNOSIS — R519 Headache, unspecified: Secondary | ICD-10-CM | POA: Diagnosis not present

## 2020-01-22 DIAGNOSIS — R52 Pain, unspecified: Secondary | ICD-10-CM

## 2020-01-22 DIAGNOSIS — B349 Viral infection, unspecified: Secondary | ICD-10-CM | POA: Diagnosis not present

## 2020-01-22 LAB — POCT URINALYSIS DIP (MANUAL ENTRY)
Glucose, UA: NEGATIVE mg/dL
Leukocytes, UA: NEGATIVE
Nitrite, UA: NEGATIVE
Protein Ur, POC: 100 mg/dL — AB
Spec Grav, UA: >=1.03 — AB (ref 1.010–1.025)
Urobilinogen, UA: 1 E.U./dL
pH, UA: 5.5 (ref 5.0–8.0)

## 2020-01-22 MED ORDER — ACETAMINOPHEN 325 MG PO TABS
650.0000 mg | ORAL_TABLET | Freq: Once | ORAL | Status: AC
  Administered 2020-01-22: 650 mg via ORAL

## 2020-01-22 MED ORDER — BENZONATATE 100 MG PO CAPS
100.0000 mg | ORAL_CAPSULE | Freq: Three times a day (TID) | ORAL | 0 refills | Status: DC
Start: 2020-01-22 — End: 2020-10-28

## 2020-01-22 NOTE — ED Provider Notes (Signed)
[facility]   [id number] 01/22/20 Arrival Time: 1853   CC: COVID symptoms  SUBJECTIVE: History from: patient.  Michelle Larson is a 25 y.o. female who presents with headache, body aches, chills, sore throat, fever that started this morning. Also reports burning with urination. States that she is currently breastfeeding. Denies sick exposure to COVID, flu or strep. Denies recent travel. Has negative history of Covid. Has not completed Covid vaccines. Has not taken OTC medications for this. There are no aggravating or alleviating factors. Denies previous symptoms in the past. Denies fever, chills, fatigue, sinus pain, rhinorrhea, sore throat, SOB, wheezing, chest pain, nausea, changes in bowel or bladder habits.    ROS: As per HPI.  All other pertinent ROS negative.     History reviewed. No pertinent past medical history. Past Surgical History:  Procedure Laterality Date  . CHOLECYSTECTOMY     No Known Allergies No current facility-administered medications on file prior to encounter.   Current Outpatient Medications on File Prior to Encounter  Medication Sig Dispense Refill  . norethindrone (MICRONOR) 0.35 MG tablet Take 1 tablet (0.35 mg total) by mouth daily. 28 tablet 11  . Prenatal Vit-Fe Fumarate-FA (PRENATAL MULTIVITAMIN) TABS tablet Take 1 tablet by mouth daily.     Social History   Socioeconomic History  . Marital status: Married    Spouse name: Not on file  . Number of children: Not on file  . Years of education: Not on file  . Highest education level: Not on file  Occupational History  . Not on file  Tobacco Use  . Smoking status: Never Smoker  . Smokeless tobacco: Never Used  Vaping Use  . Vaping Use: Never used  Substance and Sexual Activity  . Alcohol use: Not Currently  . Drug use: No  . Sexual activity: Yes    Birth control/protection: None  Other Topics Concern  . Not on file  Social History Narrative  . Not on file   Social  Determinants of Health   Financial Resource Strain: Not on file  Food Insecurity: Not on file  Transportation Needs: Not on file  Physical Activity: Not on file  Stress: Not on file  Social Connections: Not on file  Intimate Partner Violence: Not on file   Family History  Problem Relation Age of Onset  . Diabetes Mother   . Hyperlipidemia Maternal Grandmother   . Diabetes Maternal Grandfather     OBJECTIVE:  Vitals:   01/22/20 1908 01/22/20 1910  BP:  94/63  Pulse:  (!) 113  Resp:  20  Temp:  (!) 100.9 F (38.3 C)  TempSrc:  Oral  SpO2:  95%  Weight: 190 lb (86.2 kg)   Height: 5\' 2"  (1.575 m)      General appearance: alert; appears fatigued, but nontoxic; speaking in full sentences and tolerating own secretions HEENT: NCAT; Ears: EACs clear, TMs pearly gray; Eyes: PERRL.  EOM grossly intact. Sinuses: nontender; Nose: nares patent with clear rhinorrhea, Throat: oropharynx erythematous, cobblestoning present, tonsils non erythematous or enlarged, uvula midline  Neck: supple without LAD Lungs: unlabored respirations, symmetrical air entry; cough: absent; no respiratory distress; CTAB Heart: regular rate and rhythm.  Radial pulses 2+ symmetrical bilaterally Skin: warm and dry Psychological: alert and cooperative; normal mood and affect  LABS:  Results for orders placed or performed during the hospital encounter of 01/22/20 (from the past 24 hour(s))  POCT urinalysis dipstick     Status: Abnormal   Collection Time: 01/22/20  7:27  PM  Result Value Ref Range   Color, UA red (A) yellow   Clarity, UA clear clear   Glucose, UA negative negative mg/dL   Bilirubin, UA small (A) negative   Ketones, POC UA trace (5) (A) negative mg/dL   Spec Grav, UA >=4.696 (A) 1.010 - 1.025   Blood, UA large (A) negative   pH, UA 5.5 5.0 - 8.0   Protein Ur, POC =100 (A) negative mg/dL   Urobilinogen, UA 1.0 0.2 or 1.0 E.U./dL   Nitrite, UA Negative Negative   Leukocytes, UA Negative  Negative     ASSESSMENT & PLAN:  1. Viral illness   2. Body aches   3. Fever, unspecified fever cause   4. Tachycardia   5. Nonintractable headache, unspecified chronicity pattern, unspecified headache type   6. Chills   7. Sore throat     Meds ordered this encounter  Medications  . acetaminophen (TYLENOL) tablet 650 mg  . benzonatate (TESSALON) 100 MG capsule    Sig: Take 1 capsule (100 mg total) by mouth every 8 (eight) hours.    Dispense:  21 capsule    Refill:  0    Order Specific Question:   Supervising Provider    Answer:   Merrilee Jansky [id number]    Tylenol in office for fever Prescribed tessalon perles Continue supportive care at home COVID and flu testing ordered.  It will take between 2-3 days for test results. Someone will contact you regarding abnormal results. Patient should remain in quarantine until they have received Covid results.  If negative you may resume normal activities (go back to work/school) while practicing hand hygiene, social distance, and mask wearing.  If positive, patient should remain in quarantine for at least 5 days from symptom onset AND greater than 72 hours after symptoms resolution (absence of fever without the use of fever-reducing medication and improvement in respiratory symptoms), whichever is longer Get plenty of rest and push fluids Use OTC zyrtec for nasal congestion, runny nose, and/or sore throat Use OTC flonase for nasal congestion and runny nose Use medications daily for symptom relief Use OTC medications like ibuprofen or tylenol as needed fever or pain Call or go to the ED if you have any new or worsening symptoms such as fever, worsening cough, shortness of breath, chest tightness, chest pain, turning blue, changes in mental status.  Reviewed expectations re: course of current medical issues. Questions answered. Outlined signs and symptoms indicating need for more acute intervention. Patient verbalized understanding. After  Visit Summary given.         Moshe Cipro, NP 01/22/20 1947

## 2020-01-22 NOTE — Discharge Instructions (Addendum)
Your COVID test is pending.  You should self quarantine until the test result is back.    Take Tylenol or ibuprofen as needed for fever or discomfort.  Rest and keep yourself hydrated.    Follow-up with your primary care provider if your symptoms are not improving.     

## 2020-01-22 NOTE — ED Triage Notes (Signed)
Headache, body aches , chills, sore throat that started this morning.  Also reports burning with urination.

## 2020-01-26 LAB — COVID-19, FLU A+B NAA
Influenza A, NAA: NOT DETECTED
Influenza B, NAA: NOT DETECTED
SARS-CoV-2, NAA: DETECTED — AB

## 2020-02-28 DIAGNOSIS — R6883 Chills (without fever): Secondary | ICD-10-CM | POA: Diagnosis not present

## 2020-02-28 DIAGNOSIS — R0981 Nasal congestion: Secondary | ICD-10-CM | POA: Diagnosis not present

## 2020-02-28 DIAGNOSIS — J029 Acute pharyngitis, unspecified: Secondary | ICD-10-CM | POA: Diagnosis not present

## 2020-03-26 DIAGNOSIS — Z13220 Encounter for screening for lipoid disorders: Secondary | ICD-10-CM | POA: Diagnosis not present

## 2020-03-26 DIAGNOSIS — Z130 Encounter for screening for diseases of the blood and blood-forming organs and certain disorders involving the immune mechanism: Secondary | ICD-10-CM | POA: Diagnosis not present

## 2020-03-26 DIAGNOSIS — Z124 Encounter for screening for malignant neoplasm of cervix: Secondary | ICD-10-CM | POA: Diagnosis not present

## 2020-03-26 DIAGNOSIS — Z13228 Encounter for screening for other metabolic disorders: Secondary | ICD-10-CM | POA: Diagnosis not present

## 2020-03-26 DIAGNOSIS — Z113 Encounter for screening for infections with a predominantly sexual mode of transmission: Secondary | ICD-10-CM | POA: Diagnosis not present

## 2020-03-26 DIAGNOSIS — Z0000 Encounter for general adult medical examination without abnormal findings: Secondary | ICD-10-CM | POA: Diagnosis not present

## 2020-10-07 ENCOUNTER — Encounter (HOSPITAL_COMMUNITY): Payer: Self-pay | Admitting: *Deleted

## 2020-10-07 ENCOUNTER — Emergency Department (HOSPITAL_COMMUNITY): Payer: Medicaid Other

## 2020-10-07 ENCOUNTER — Emergency Department (HOSPITAL_COMMUNITY)
Admission: EM | Admit: 2020-10-07 | Discharge: 2020-10-08 | Disposition: A | Discharge status: Home / Self Care | Payer: Medicaid Other | Attending: Emergency Medicine | Admitting: Emergency Medicine

## 2020-10-07 ENCOUNTER — Other Ambulatory Visit: Payer: Self-pay

## 2020-10-07 DIAGNOSIS — K5790 Diverticulosis of intestine, part unspecified, without perforation or abscess without bleeding: Secondary | ICD-10-CM | POA: Diagnosis not present

## 2020-10-07 DIAGNOSIS — Z9049 Acquired absence of other specified parts of digestive tract: Secondary | ICD-10-CM | POA: Diagnosis not present

## 2020-10-07 DIAGNOSIS — R109 Unspecified abdominal pain: Secondary | ICD-10-CM | POA: Diagnosis not present

## 2020-10-07 DIAGNOSIS — R519 Headache, unspecified: Secondary | ICD-10-CM | POA: Insufficient documentation

## 2020-10-07 DIAGNOSIS — N3289 Other specified disorders of bladder: Secondary | ICD-10-CM | POA: Diagnosis not present

## 2020-10-07 DIAGNOSIS — R509 Fever, unspecified: Secondary | ICD-10-CM | POA: Diagnosis not present

## 2020-10-07 DIAGNOSIS — N12 Tubulo-interstitial nephritis, not specified as acute or chronic: Secondary | ICD-10-CM | POA: Insufficient documentation

## 2020-10-07 DIAGNOSIS — R000 Tachycardia, unspecified: Secondary | ICD-10-CM | POA: Insufficient documentation

## 2020-10-07 LAB — CBC WITH DIFFERENTIAL/PLATELET
Abs Immature Granulocytes: 0.06 10*3/uL (ref 0.00–0.07)
Basophils Absolute: 0 10*3/uL (ref 0.0–0.1)
Basophils Relative: 0 %
Eosinophils Absolute: 0.1 10*3/uL (ref 0.0–0.5)
Eosinophils Relative: 1 %
HCT: 43.1 % (ref 36.0–46.0)
Hemoglobin: 14.4 g/dL (ref 12.0–15.0)
Immature Granulocytes: 1 %
Lymphocytes Relative: 13 %
Lymphs Abs: 1.6 10*3/uL (ref 0.7–4.0)
MCH: 31.6 pg (ref 26.0–34.0)
MCHC: 33.4 g/dL (ref 30.0–36.0)
MCV: 94.5 fL (ref 80.0–100.0)
Monocytes Absolute: 0.8 10*3/uL (ref 0.1–1.0)
Monocytes Relative: 7 %
Neutro Abs: 9.8 10*3/uL — ABNORMAL HIGH (ref 1.7–7.7)
Neutrophils Relative %: 78 %
Platelets: 235 10*3/uL (ref 150–400)
RBC: 4.56 MIL/uL (ref 3.87–5.11)
RDW: 12.4 % (ref 11.5–15.5)
WBC: 12.4 10*3/uL — ABNORMAL HIGH (ref 4.0–10.5)
nRBC: 0 % (ref 0.0–0.2)

## 2020-10-07 LAB — POC URINE PREG, ED: Preg Test, Ur: NEGATIVE

## 2020-10-07 LAB — URINALYSIS, ROUTINE W REFLEX MICROSCOPIC
Bilirubin Urine: NEGATIVE
Glucose, UA: NEGATIVE mg/dL
Ketones, ur: NEGATIVE mg/dL
Nitrite: NEGATIVE
Protein, ur: 30 mg/dL — AB
RBC / HPF: >50 RBC/hpf — ABNORMAL HIGH (ref 0–5)
Specific Gravity, Urine: 1.018 (ref 1.005–1.030)
pH: 8 (ref 5.0–8.0)

## 2020-10-07 LAB — COMPREHENSIVE METABOLIC PANEL
ALT: 61 U/L — ABNORMAL HIGH (ref 0–44)
AST: 35 U/L (ref 15–41)
Albumin: 4.7 g/dL (ref 3.5–5.0)
Alkaline Phosphatase: 122 U/L (ref 38–126)
Anion gap: 8 (ref 5–15)
BUN: 11 mg/dL (ref 6–20)
CO2: 25 mmol/L (ref 22–32)
Calcium: 8.9 mg/dL (ref 8.9–10.3)
Chloride: 103 mmol/L (ref 98–111)
Creatinine, Ser: 0.59 mg/dL (ref 0.44–1.00)
GFR, Estimated: >60 mL/min (ref >60)
Glucose, Bld: 94 mg/dL (ref 70–99)
Potassium: 3.3 mmol/L — ABNORMAL LOW (ref 3.5–5.1)
Sodium: 136 mmol/L (ref 135–145)
Total Bilirubin: 1.5 mg/dL — ABNORMAL HIGH (ref 0.3–1.2)
Total Protein: 8.4 g/dL — ABNORMAL HIGH (ref 6.5–8.1)

## 2020-10-07 LAB — LACTIC ACID, PLASMA
Lactic Acid, Venous: 1.2 mmol/L (ref 0.5–1.9)
Lactic Acid, Venous: 1.3 mmol/L (ref 0.5–1.9)

## 2020-10-07 MED ORDER — ACETAMINOPHEN 325 MG PO TABS
650.0000 mg | ORAL_TABLET | Freq: Once | ORAL | Status: AC | PRN
  Administered 2020-10-07: 650 mg via ORAL
  Filled 2020-10-07: qty 2

## 2020-10-07 MED ORDER — KETOROLAC TROMETHAMINE 30 MG/ML IJ SOLN
30.0000 mg | Freq: Once | INTRAMUSCULAR | Status: AC
  Administered 2020-10-08: 30 mg via INTRAVENOUS
  Filled 2020-10-07: qty 1

## 2020-10-07 NOTE — ED Triage Notes (Signed)
Pt reporting left "kidney pain" (flank pain) that started on Friday. Pain with urination. Fevers started this morning. No nausea or vomiting. Headache. Last took tylenol around 10 am this morning.

## 2020-10-07 NOTE — ED Notes (Signed)
Patient aware that we need urine sample for testing, unable at this time. Pt given instruction on providing urine sample when able to do so.   

## 2020-10-08 DIAGNOSIS — Z9049 Acquired absence of other specified parts of digestive tract: Secondary | ICD-10-CM | POA: Diagnosis not present

## 2020-10-08 DIAGNOSIS — N3289 Other specified disorders of bladder: Secondary | ICD-10-CM | POA: Diagnosis not present

## 2020-10-08 DIAGNOSIS — K5790 Diverticulosis of intestine, part unspecified, without perforation or abscess without bleeding: Secondary | ICD-10-CM | POA: Diagnosis not present

## 2020-10-08 MED ORDER — CEPHALEXIN 500 MG PO CAPS
500.0000 mg | ORAL_CAPSULE | Freq: Three times a day (TID) | ORAL | 0 refills | Status: DC
Start: 2020-10-08 — End: 2020-10-28

## 2020-10-08 MED ORDER — SODIUM CHLORIDE 0.9 % IV SOLN
1.0000 g | Freq: Once | INTRAVENOUS | Status: AC
  Administered 2020-10-08: 1 g via INTRAVENOUS
  Filled 2020-10-08: qty 10

## 2020-10-08 NOTE — Discharge Instructions (Addendum)
You were seen today for flank pain and urinary symptoms.  You may have recently passed a stone; however, you may also have a kidney infection based on your urinalysis and your CT results.  Take medications as prescribed.  If you develop fevers, worsening pain, nausea, vomiting, you should be reevaluated.  Take ibuprofen for any fevers or pain at home.

## 2020-10-08 NOTE — ED Provider Notes (Signed)
[facility] EMERGENCY DEPARTMENT Provider Note   CSN: [id number] Arrival date & time: 10/07/20  1914     History Chief Complaint  Patient presents with   Flank Pain    Renatha Rosen is a 25 y.o. female.  HPI     This is a 25 year old female who presents with left flank pain.  Patient states that pain started roughly 2 days ago.  She reports dysuria.  She started her period so is unsure whether she has hematuria.  She reports sharp left-sided flank pain that is nonradiating.  She reports chills.  No documented fevers.  She also reports headache.  She took Tylenol with some relief.  She does not believe herself to be pregnant.  Currently she states she is fairly comfortable.  She is not in significant pain.  She reports nausea without vomiting.  History reviewed. No pertinent past medical history.  Patient Active Problem List   Diagnosis Date Noted   DUB (dysfunctional uterine bleeding) 11/13/2019    Past Surgical History:  Procedure Laterality Date   CHOLECYSTECTOMY       OB History     Gravida  4   Para  3   Term  3   Preterm      AB  1   Living  3      SAB  1   IAB      Ectopic      Multiple  0   Live Births  3           Family History  Problem Relation Age of Onset   Diabetes Mother    Hyperlipidemia Maternal Grandmother    Diabetes Maternal Grandfather     Social History   Tobacco Use   Smoking status: Never   Smokeless tobacco: Never  Vaping Use   Vaping Use: Never used  Substance Use Topics   Alcohol use: Not Currently   Drug use: No    Home Medications Prior to Admission medications   Medication Sig Start Date End Date Taking? Authorizing Provider  cephALEXin (KEFLEX) 500 MG capsule Take 1 capsule (500 mg total) by mouth 3 (three) times daily. 10/08/20  Yes Skyler Carel, Mayer Masker, MD  benzonatate (TESSALON) 100 MG capsule Take 1 capsule (100 mg total) by mouth every 8 (eight) hours. 01/22/20   Moshe Cipro, NP   norethindrone (MICRONOR) 0.35 MG tablet Take 1 tablet (0.35 mg total) by mouth daily. 11/13/19   Arabella Merles, CNM  Prenatal Vit-Fe Fumarate-FA (PRENATAL MULTIVITAMIN) TABS tablet Take 1 tablet by mouth daily.    [provider]    Allergies    Patient has no known allergies.  Review of Systems   Review of Systems  Constitutional:  Positive for chills and fever.  Respiratory:  Negative for shortness of breath.   Cardiovascular:  Negative for chest pain.  Gastrointestinal:  Positive for nausea. Negative for abdominal pain and vomiting.  Genitourinary:  Positive for dysuria, flank pain and hematuria.  All other systems reviewed and are negative.  Physical Exam Updated Vital Signs BP 114/68 (BP Location: Right Arm)   Pulse (!) 102   Temp 100.3 F (37.9 C)   Resp 20   Ht 1.575 m (5\' 2" )   Wt 89.8 kg   LMP 09/25/2020 (Approximate)   SpO2 98%   BMI 36.21 kg/m   Physical Exam Vitals and nursing note reviewed.  Constitutional:      Appearance: She is well-developed. She is not ill-appearing.  HENT:  Head: Normocephalic and atraumatic.     Mouth/Throat:     Mouth: Mucous membranes are moist.  Eyes:     Pupils: Pupils are equal, round, and reactive to light.  Cardiovascular:     Rate and Rhythm: Normal rate and regular rhythm.     Heart sounds: Normal heart sounds.  Pulmonary:     Effort: Pulmonary effort is normal. No respiratory distress.     Breath sounds: No wheezing.  Abdominal:     General: Bowel sounds are normal.     Palpations: Abdomen is soft.     Tenderness: There is no abdominal tenderness. There is left CVA tenderness. There is no right CVA tenderness.  Musculoskeletal:     Cervical back: Neck supple.     Right lower leg: No edema.     Left lower leg: No edema.  Skin:    General: Skin is warm and dry.  Neurological:     Mental Status: She is alert and oriented to person, place, and time.  Psychiatric:        Mood and Affect: Mood normal.     ED Results / Procedures / Treatments   Labs (all labs ordered are listed, but only abnormal results are displayed) Labs Reviewed  COMPREHENSIVE METABOLIC PANEL - Abnormal; Notable for the following components:      Result Value   Potassium 3.3 (*)    Total Protein 8.4 (*)    ALT 61 (*)    Total Bilirubin 1.5 (*)    All other components within normal limits  CBC WITH DIFFERENTIAL/PLATELET - Abnormal; Notable for the following components:   WBC 12.4 (*)    Neutro Abs 9.8 (*)    All other components within normal limits  URINALYSIS, ROUTINE W REFLEX MICROSCOPIC - Abnormal; Notable for the following components:   APPearance HAZY (*)    Hgb urine dipstick LARGE (*)    Protein, ur 30 (*)    Leukocytes,Ua TRACE (*)    RBC / HPF >50 (*)    Bacteria, UA RARE (*)    All other components within normal limits  URINE CULTURE  LACTIC ACID, PLASMA  LACTIC ACID, PLASMA  POC URINE PREG, ED  POC URINE PREG, ED    EKG None  Radiology DG Chest 2 View  Result Date: 10/07/2020 CLINICAL DATA:  Fever and left flank pain. EXAM: CHEST - 2 VIEW COMPARISON:  May 06, 2019 FINDINGS: The heart size and mediastinal contours are within normal limits. Both lungs are clear. The visualized skeletal structures are unremarkable. IMPRESSION: No active cardiopulmonary disease. Electronically Signed   By: Aram Candela M.D.   On: 10/07/2020 20:25   CT Renal Stone Study  Result Date: 10/08/2020 CLINICAL DATA:  Flank pain on the left with difficulty urinating, initial encounter EXAM: CT ABDOMEN AND PELVIS WITHOUT CONTRAST TECHNIQUE: Multidetector CT imaging of the abdomen and pelvis was performed following the standard protocol without IV contrast. COMPARISON:  None. FINDINGS: Lower chest: No acute abnormality. Hepatobiliary: No focal liver abnormality is seen. Status post cholecystectomy. No biliary dilatation. Pancreas: Unremarkable. No pancreatic ductal dilatation or surrounding inflammatory changes.  Spleen: Normal in size without focal abnormality. Adrenals/Urinary Tract: Adrenal glands are within normal limits. Kidneys show no renal calculi or obstructive changes. Mild perinephric stranding is noted on the left. Ureters are within normal limits. Bladder is partially distended. Stomach/Bowel: Scattered mild diverticular changes noted without evidence of diverticulitis. The colon is predominately decompressed. No obstructive changes are seen. The appendix is  unremarkable. Small bowel and stomach are within normal limits. Vascular/Lymphatic: No significant vascular findings are present. No enlarged abdominal or pelvic lymph nodes. Reproductive: Uterus and bilateral adnexa are unremarkable. Other: No abdominal wall hernia or abnormality. No abdominopelvic ascites. Musculoskeletal: No acute or significant osseous findings. IMPRESSION: Mild perinephric stranding on the left. These changes may be related to recently passed stone. No definitive calculi are noted. Mild diverticular change without diverticulitis. Electronically Signed   By: Alcide Clever M.D.   On: 10/08/2020 00:16    Procedures Procedures   Medications Ordered in ED Medications  cefTRIAXone (ROCEPHIN) 1 g in sodium chloride 0.9 % 100 mL IVPB (1 g Intravenous New Bag/Given 10/08/20 0112)  acetaminophen (TYLENOL) tablet 650 mg (650 mg Oral Given 10/07/20 1958)  ketorolac (TORADOL) 30 MG/ML injection 30 mg (30 mg Intravenous Given 10/08/20 0032)    ED Course  I have reviewed the triage vital signs and the nursing notes.  Pertinent labs & imaging results that were available during my care of the patient were reviewed by me and considered in my medical decision making (see chart for details).    MDM Rules/Calculators/A&P                           Patient presents with left flank pain.  She is nontoxic-appearing.  She has febrile to 103.2.  She is slightly tachycardic.  She has some left-sided CVA tenderness.  No abdominal pain.   Considerations include but not limited to, pyelonephritis, infected stone, kidney stone.  Patient given pain medication.  Labs reviewed.  Slight leukocytosis to 12.4 with a left shift.  Otherwise urinalysis shows greater than 50 red cells, 21-50 white cells, and rare bacteria.  Urine culture was sent.  CT stone study was obtained which shows some left-sided perinephric stranding.  Could represent a recently passed stone versus pyelonephritis.  We will treat for both.  Patient is clinically well-appearing.  She does not appear septic.  Patient was given IV Rocephin.  Will discharge on Keflex.  Urine culture pending.  Patient states understanding and is agreeable to plan.  After history, exam, and medical workup I feel the patient has been appropriately medically screened and is safe for discharge home. Pertinent diagnoses were discussed with the patient. Patient was given return precautions.  Final Clinical Impression(s) / ED Diagnoses Final diagnoses:  Pyelonephritis    Rx / DC Orders ED Discharge Orders          Ordered    cephALEXin (KEFLEX) 500 MG capsule  3 times daily        10/08/20 0115             Giuliano Preece, Mayer Masker, MD 10/08/20 [phone]

## 2020-10-09 ENCOUNTER — Telehealth: Payer: Self-pay

## 2020-10-09 NOTE — Telephone Encounter (Signed)
Transition Care Management Unsuccessful Follow-up Telephone Call  Date of discharge and from where:  10/08/2020[facility]  Attempts:  1st Attempt  Reason for unsuccessful TCM follow-up call:  Left voice message

## 2020-10-10 LAB — URINE CULTURE: Culture: >=100000 — AB

## 2020-10-10 NOTE — Telephone Encounter (Signed)
Transition Care Management Unsuccessful Follow-up Telephone Call  Date of discharge and from where:  10/08/2020 from [facility]  Attempts:  2nd Attempt  Reason for unsuccessful TCM follow-up call:  Left voice message

## 2020-10-11 ENCOUNTER — Telehealth: Payer: Self-pay | Admitting: *Deleted

## 2020-10-11 NOTE — Telephone Encounter (Signed)
Transition Care Management Follow-up Telephone Call Date of discharge and from where: 10/08/2020 from [facility] How have you been since you were released from the hospital? Pt stated that she is feeling much better and did not have any questions or concerns at this time. Pt has started the abx cephalexin that was rx'ed at discharge.  Any questions or concerns? No  Items Reviewed: Did the pt receive and understand the discharge instructions provided? Yes  Medications obtained and verified? Yes  Other? No  Any new allergies since your discharge? No  Dietary orders reviewed? No Do you have support at home? Yes   Functional Questionnaire: (I = Independent and D = Dependent) ADLs: I  Bathing/Dressing- I  Meal Prep- I  Eating- I  Maintaining continence- I  Transferring/Ambulation- I  Managing Meds- I   Follow up appointments reviewed:  PCP Hospital f/u appt confirmed? No   Specialist Hospital f/u appt confirmed? No   Are transportation arrangements needed? No  If their condition worsens, is the pt aware to call PCP or go to the Emergency Dept.? Yes Was the patient provided with contact information for the PCP's office or ED? Yes Was to pt encouraged to call back with questions or concerns? Yes

## 2020-10-11 NOTE — Telephone Encounter (Signed)
Post ED Visit - Positive Culture Follow-up  Culture report reviewed by antimicrobial stewardship pharmacist: [facility] []  , Pharm.D. []  Enzo Bi, Pharm.D., BCPS AQ-ID []  , Pharm.D., BCPS []  Celedonio Miyamoto, .D., BCPS []  [place], .D., BCPS, AAHIVP []  Georgina Pillion, Pharm.D., BCPS, AAHIVP []  [address], PharmD, BCPS []  , PharmD, BCPS []  [place], PharmD, BCPS []  [address], PharmD []  , PharmD, BCPS []  Estella Husk, PharmD  Pharmacy Team []  Lysle Pearl, PharmD []  , PharmD []  Phillips Climes, PharmD []  , Rph []  Agapito Games) , PharmD []  Verlan Friends, PharmD []  , PharmD []  Mervyn Gay, PharmD []  , PharmD []  Vinnie Level, PharmD []  [facility], PharmD []  , PharmD []  Len Childs, PharmD   Positive urine culture Treated with Cephalexin, organism sensitive to the same and no further patient follow-up is required at this time.  , PharmD  Greer Pickerel Talley 10/11/2020, 9:31 AM

## 2020-10-22 ENCOUNTER — Ambulatory Visit: Payer: Medicaid Other

## 2020-10-28 ENCOUNTER — Ambulatory Visit: Payer: Medicaid Other | Admitting: Adult Health

## 2020-10-28 ENCOUNTER — Encounter: Payer: Self-pay | Admitting: Adult Health

## 2020-10-28 ENCOUNTER — Other Ambulatory Visit: Payer: Self-pay

## 2020-10-28 VITALS — BP 103/63 | HR 65 | Ht 62.0 in | Wt 196.0 lb

## 2020-10-28 DIAGNOSIS — Z30017 Encounter for initial prescription of implantable subdermal contraceptive: Secondary | ICD-10-CM | POA: Diagnosis not present

## 2020-10-28 DIAGNOSIS — Z113 Encounter for screening for infections with a predominantly sexual mode of transmission: Secondary | ICD-10-CM | POA: Insufficient documentation

## 2020-10-28 DIAGNOSIS — Z3202 Encounter for pregnancy test, result negative: Secondary | ICD-10-CM | POA: Insufficient documentation

## 2020-10-28 LAB — POCT URINE PREGNANCY: Preg Test, Ur: NEGATIVE

## 2020-10-28 MED ORDER — ETONOGESTREL 68 MG ~~LOC~~ IMPL
68.0000 mg | DRUG_IMPLANT | Freq: Once | SUBCUTANEOUS | Status: AC
  Administered 2020-10-28: 68 mg via SUBCUTANEOUS

## 2020-10-28 NOTE — Patient Instructions (Signed)
Use condoms x 2 weeks, keep clean and dry x 24 hours, no heavy lifting, keep steri strips on x 72 hours, Keep pressure dressing on x 24 hours. Follow up prn problems.  

## 2020-10-28 NOTE — Progress Notes (Signed)
  Subjective:     Patient ID: Michelle Larson, female   DOB: 02-Aug-1995, 25 y.o.   MRN: [id number]  HPI Michelle Larson is a 25 year old Hispanic female, married, [id number], in to discuss nexplanon, has had IUD and used OCs in the past. Had normal pap, 03/2020 PCP is [facility].  Review of Systems For nexplanon insertion Reviewed past medical,surgical, social and family history. Reviewed medications and allergies.     Objective:   Physical Exam BP 103/63 (BP Location: Right Arm, Patient Position: Sitting, Cuff Size: Normal)   Pulse 65   Ht 5\' 2"  (1.575 m)   Wt 196 lb (88.9 kg)   LMP 10/22/2020 (Exact Date)   Breastfeeding No   BMI 35.85 kg/m  UPT is negative. Consent signed, time out called. Left arm cleansed with betadine, and injected with 1.5 cc 2% lidocaine and waited til numb. Nexplanon easily inserted and steri strips applied.Rod easily palpated by provider and pt. Pressure dressing applied.    AA is 0 Depression screen [facility] 2/9 10/28/2020 12/07/2018  Decreased Interest 0 0  Down, Depressed, Hopeless 0 0  PHQ - 2 Score 0 0     [facility]stream - 10/28/20 0914       Pregnancy Intention Screening   Does the patient want to become pregnant in the next year? No    Does the patient's partner want to become pregnant in the next year? No    Would the patient like to discuss contraceptive options today? Yes      Contraception Wrap Up   Current Method No Contraceptive Precautions    End Method Hormonal Implant    Contraception Counseling Provided Yes             Assessment:     1. Pregnancy test negative  2. Screen for STD (sexually transmitted disease) Urine sent for GC/CHL  3. Nexplanon insertion Lot 10/30/20 Exp [id number]    Plan:    Gave handout on nexplanon Use condoms x 2 weeks, keep clean and dry x 24 hours, no heavy lifting, keep steri strips on x 72 hours, Keep pressure dressing on x 24 hours. Follow up prn problems.    Remove in 3 years or sooner if desired.

## 2020-10-30 LAB — GC/CHLAMYDIA PROBE AMP
Chlamydia trachomatis, NAA: NEGATIVE
Neisseria Gonorrhoeae by PCR: NEGATIVE

## 2020-11-29 ENCOUNTER — Emergency Department (HOSPITAL_COMMUNITY)
Admission: EM | Admit: 2020-11-29 | Discharge: 2020-11-29 | Disposition: A | Discharge status: Home / Self Care | Payer: Medicaid Other | Attending: Emergency Medicine | Admitting: Emergency Medicine

## 2020-11-29 ENCOUNTER — Other Ambulatory Visit: Payer: Self-pay

## 2020-11-29 DIAGNOSIS — Z20822 Contact with and (suspected) exposure to covid-19: Secondary | ICD-10-CM | POA: Insufficient documentation

## 2020-11-29 DIAGNOSIS — B349 Viral infection, unspecified: Secondary | ICD-10-CM | POA: Insufficient documentation

## 2020-11-29 DIAGNOSIS — J101 Influenza due to other identified influenza virus with other respiratory manifestations: Secondary | ICD-10-CM | POA: Diagnosis not present

## 2020-11-29 DIAGNOSIS — R059 Cough, unspecified: Secondary | ICD-10-CM | POA: Diagnosis not present

## 2020-11-29 LAB — RESP PANEL BY RT-PCR (FLU A&B, COVID) ARPGX2
Influenza A by PCR: POSITIVE — AB
Influenza B by PCR: NEGATIVE
SARS Coronavirus 2 by RT PCR: NEGATIVE

## 2020-11-29 MED ORDER — BENZONATATE 200 MG PO CAPS
200.0000 mg | ORAL_CAPSULE | Freq: Three times a day (TID) | ORAL | 0 refills | Status: DC | PRN
Start: 2020-11-29 — End: 2021-12-23

## 2020-11-29 MED ORDER — PSEUDOEPHEDRINE HCL ER 120 MG PO TB12: 120.0000 mg | ORAL_TABLET | Freq: Two times a day (BID) | ORAL | 0 refills | Status: DC

## 2020-11-29 NOTE — Discharge Instructions (Signed)
Your COVID and flu tests are pending.  You may review your results on the MyChart app.  Results should be available later this evening or tomorrow.  Please drink plenty of fluids.  Alternate Tylenol and ibuprofen every 4 and 6 hours respectively.  This will help with fever and body aches.  Follow-up with your primary care provider for recheck.  Return emergency department for any new or worsening symptoms.

## 2020-11-29 NOTE — ED Provider Notes (Signed)
[facility] Provider Note   CSN: [id number] Arrival date & time: 11/29/20  1744     History Chief Complaint  Patient presents with   Cough   Nausea   Nasal Congestion    Michelle Larson is a 25 y.o. female.   Cough Associated symptoms: rhinorrhea   Associated symptoms: no chest pain, no chills, no fever, no headaches, no myalgias, no rash, no shortness of breath, no sore throat and no wheezing       Michelle Larson is a 25 y.o. female who presents to the Emergency Department complaining of generalized body aches, cough, sneezing, fever and runny nose.  Symptoms present for several days.  Gradually worsening.  Her 2 children are here for evaluation with similar symptoms.  States her cough has been occasionally productive.  Clear rhinorrhea.  She has been alternating Tylenol and ibuprofen.  Some shortness of breath associated with cough.  She denies vomiting or diarrhea.  No dysuria.   No past medical history on file.  Patient Active Problem List   Diagnosis Date Noted   Nexplanon insertion 10/28/2020   Screen for STD (sexually transmitted disease) 10/28/2020   Pregnancy test negative 10/28/2020   DUB (dysfunctional uterine bleeding) 11/13/2019    Past Surgical History:  Procedure Laterality Date   CHOLECYSTECTOMY       OB History     Gravida  4   Para  3   Term  3   Preterm      AB  1   Living  3      SAB  1   IAB      Ectopic      Multiple  0   Live Births  3           Family History  Problem Relation Age of Onset   Hyperlipidemia Maternal Grandmother    Diabetes Maternal Grandfather    Diabetes Mother     Social History   Tobacco Use   Smoking status: Never   Smokeless tobacco: Never  Vaping Use   Vaping Use: Never used  Substance Use Topics   Alcohol use: Not Currently   Drug use: No    Home Medications Prior to Admission medications   Not on File    Allergies    Patient has no known  allergies.  Review of Systems   Review of Systems  Constitutional:  Positive for appetite change. Negative for chills, fatigue and fever.  HENT:  Positive for congestion, rhinorrhea and sneezing. Negative for sore throat.   Respiratory:  Positive for cough. Negative for shortness of breath and wheezing.   Cardiovascular:  Negative for chest pain and palpitations.  Gastrointestinal:  Positive for nausea. Negative for abdominal pain, blood in stool and vomiting.  Genitourinary:  Negative for dysuria, flank pain and hematuria.  Musculoskeletal:  Negative for arthralgias, back pain, myalgias, neck pain and neck stiffness.  Skin:  Negative for rash.  Neurological:  Negative for dizziness, weakness, numbness and headaches.  Hematological:  Does not bruise/bleed easily.   Physical Exam Updated Vital Signs BP 122/78 (BP Location: Right Arm)   Pulse (!) 102   Temp 100.2 F (37.9 C) (Oral)   Resp 18   Ht 5\' 2"  (1.575 m)   Wt 89.8 kg   SpO2 98%   BMI 36.21 kg/m   Physical Exam Vitals and nursing note reviewed.  Constitutional:      Appearance: Normal appearance. She is not ill-appearing or toxic-appearing.  HENT:     Head: Normocephalic.     Right Ear: Tympanic membrane and ear canal normal.     Left Ear: Tympanic membrane and ear canal normal.     Nose: Congestion present.     Mouth/Throat:     Mouth: Mucous membranes are moist.     Pharynx: No oropharyngeal exudate or posterior oropharyngeal erythema.  Neck:     Thyroid: No thyromegaly.     Meningeal: Kernig's sign absent.  Cardiovascular:     Rate and Rhythm: Normal rate and regular rhythm.     Pulses: Normal pulses.  Pulmonary:     Effort: Pulmonary effort is normal. No respiratory distress.     Breath sounds: Normal breath sounds. No wheezing.  Abdominal:     Palpations: Abdomen is soft.     Tenderness: There is no abdominal tenderness. There is no guarding or rebound.  Musculoskeletal:        General: Normal range of  motion.     Cervical back: Normal range of motion and neck supple.  Lymphadenopathy:     Cervical: No cervical adenopathy.  Skin:    General: Skin is warm.     Findings: No rash.  Neurological:     General: No focal deficit present.     Mental Status: She is alert.     Sensory: No sensory deficit.     Motor: No weakness.    ED Results / Procedures / Treatments   Labs (all labs ordered are listed, but only abnormal results are displayed) Labs Reviewed  RESP PANEL BY RT-PCR (FLU A&B, COVID) [id number]    EKG None  Radiology No results found.  Procedures Procedures   Medications Ordered in ED Medications - No data to display  ED Course  I have reviewed the triage vital signs and the nursing notes.  Pertinent labs & imaging results that were available during my care of the patient were reviewed by me and considered in my medical decision making (see chart for details).    MDM Rules/Calculators/A&P                           Pt here with sx's concerning for viral illness.  Sx's present for several days.  Her child here with similar sx's   On exam, she is non toxic appearing.  Vitals reviewed.  Low grade fever.  Doubt emergent process.  Influenza A positive.  Discussed results with patient.  She is agreeable to symptomatic treatment and close out patient f/u.  Return precautions discussed.  On dispo I was informed by nursing that pt vomited x one. Zofran ordered.  Offered pt observation and she declined, stating that she is ready to go home.  Denies abdominal pain.    Final Clinical Impression(s) / ED Diagnoses Final diagnoses:  Viral illness    Rx / DC Orders ED Discharge Orders     None        Pauline Aus, PA-C 12/02/20 2226    Vanetta Mulders, MD 12/05/20 0002

## 2020-11-29 NOTE — ED Triage Notes (Signed)
Cough, nasal congestion, temp 100.0 F. Body aches and head aches.

## 2020-12-09 ENCOUNTER — Other Ambulatory Visit: Payer: Medicaid Other | Admitting: Adult Health

## 2021-02-26 ENCOUNTER — Emergency Department (HOSPITAL_COMMUNITY): Payer: Medicaid Other

## 2021-02-26 ENCOUNTER — Encounter (HOSPITAL_COMMUNITY): Payer: Self-pay | Admitting: Emergency Medicine

## 2021-02-26 ENCOUNTER — Emergency Department (HOSPITAL_COMMUNITY)
Admission: EM | Admit: 2021-02-26 | Discharge: 2021-02-26 | Disposition: A | Discharge status: Home / Self Care | Payer: Medicaid Other | Attending: Emergency Medicine | Admitting: Emergency Medicine

## 2021-02-26 ENCOUNTER — Other Ambulatory Visit: Payer: Self-pay

## 2021-02-26 DIAGNOSIS — R079 Chest pain, unspecified: Secondary | ICD-10-CM | POA: Insufficient documentation

## 2021-02-26 DIAGNOSIS — R0789 Other chest pain: Secondary | ICD-10-CM | POA: Diagnosis not present

## 2021-02-26 LAB — BASIC METABOLIC PANEL
Anion gap: 7 (ref 5–15)
BUN: 12 mg/dL (ref 6–20)
CO2: 22 mmol/L (ref 22–32)
Calcium: 8.6 mg/dL — ABNORMAL LOW (ref 8.9–10.3)
Chloride: 109 mmol/L (ref 98–111)
Creatinine, Ser: 0.49 mg/dL (ref 0.44–1.00)
GFR, Estimated: >60 mL/min (ref >60)
Glucose, Bld: 102 mg/dL — ABNORMAL HIGH (ref 70–99)
Potassium: 3.6 mmol/L (ref 3.5–5.1)
Sodium: 138 mmol/L (ref 135–145)

## 2021-02-26 LAB — CBC
HCT: 41.5 % (ref 36.0–46.0)
Hemoglobin: 14.2 g/dL (ref 12.0–15.0)
MCH: 31.5 pg (ref 26.0–34.0)
MCHC: 34.2 g/dL (ref 30.0–36.0)
MCV: 92 fL (ref 80.0–100.0)
Platelets: 243 10*3/uL (ref 150–400)
RBC: 4.51 MIL/uL (ref 3.87–5.11)
RDW: 12.5 % (ref 11.5–15.5)
WBC: 7.2 10*3/uL (ref 4.0–10.5)
nRBC: 0 % (ref 0.0–0.2)

## 2021-02-26 LAB — D-DIMER, QUANTITATIVE: D-Dimer, Quant: 0.3 ug/mL-FEU (ref 0.00–0.50)

## 2021-02-26 LAB — TROPONIN I (HIGH SENSITIVITY): Troponin I (High Sensitivity): <2 ng/L (ref <18)

## 2021-02-26 LAB — HCG, QUANTITATIVE, PREGNANCY: hCG, Beta Chain, Quant, S: <1 m[IU]/mL (ref <5)

## 2021-02-26 MED ORDER — ACETAMINOPHEN 500 MG PO TABS
1000.0000 mg | ORAL_TABLET | Freq: Once | ORAL | Status: AC
  Administered 2021-02-26: 1000 mg via ORAL
  Filled 2021-02-26: qty 2

## 2021-02-26 MED ORDER — IBUPROFEN 800 MG PO TABS: 800.0000 mg | ORAL_TABLET | Freq: Once | ORAL | Status: DC

## 2021-02-26 NOTE — Discharge Instructions (Signed)
You were evaluated in the Emergency Department and after careful evaluation, we did not find any emergent condition requiring admission or further testing in the hospital.  Your exam/testing today was overall reassuring.  Pain likely due to costochondritis.  Recommend Tylenol and/or Motrin every 4-6 hours at home for pain.  Please return to the Emergency Department if you experience any worsening of your condition.  Thank you for allowing [place] to be a part of your care.

## 2021-02-26 NOTE — ED Provider Notes (Signed)
[facility]-EMERGENCY DEPT [facility] Emergency Department Provider Note MRN:  [id number]  Arrival date & time: 02/26/21     Chief Complaint   Chest Pain   History of Present Illness   Michelle Larson is a 26 y.o. year-old female with no pertinent past medical history presenting to the ED with chief complaint of chest pain.  Pain beneath the left breast, sudden onset this evening, constant.  Worse with deep breaths.  No history of blood clots, no birth control pills, no leg pain or swelling recently.  Review of Systems  A thorough review of systems was obtained and all systems are negative except as noted in the HPI and PMH.   Patient's Health History   History reviewed. No pertinent past medical history.  Past Surgical History:  Procedure Laterality Date   CHOLECYSTECTOMY      Family History  Problem Relation Age of Onset   Hyperlipidemia Maternal Grandmother    Diabetes Maternal Grandfather    Diabetes Mother     Social History   Socioeconomic History   Marital status: Married    Spouse name: Not on file   Number of children: Not on file   Years of education: Not on file   Highest education level: Not on file  Occupational History   Not on file  Tobacco Use   Smoking status: Never   Smokeless tobacco: Never  Vaping Use   Vaping Use: Never used  Substance and Sexual Activity   Alcohol use: Not Currently   Drug use: No   Sexual activity: Yes    Birth control/protection: None, Coitus interruptus, Implant  Other Topics Concern   Not on file  Social History Narrative   Not on file   Social Determinants of Health   Financial Resource Strain: Low Risk    Difficulty of Paying Living Expenses: Not hard at all  Food Insecurity: No Food Insecurity   Worried About Programme researcher, broadcasting/film/video in the Last Year: Never true   Ran Out of Food in the Last Year: Never true  Transportation Needs: No Transportation Needs   Lack of Transportation (Medical): No   Lack of  Transportation (Non-Medical): No  Physical Activity: Inactive   Days of Exercise per Week: 0 days   Minutes of Exercise per Session: 0 min  Stress: No Stress Concern Present   Feeling of Stress : Not at all  Social Connections: Moderately Integrated   Frequency of Communication with Friends and Family: More than three times a week   Frequency of Social Gatherings with Friends and Family: More than three times a week   Attends Religious Services: More than 4 times per year   Active Member of [employer] or Organizations: No   Attends Banker Meetings: Never   Marital Status: Married  Catering manager Violence: Not At Risk   Fear of Current or Ex-Partner: No   Emotionally Abused: No   Physically Abused: No   Sexually Abused: No     Physical Exam   Vitals:   02/26/21 0330 02/26/21 0400  BP:  (!) 99/55  Pulse: 71 70  Resp: 16 12  Temp:    SpO2: 96% 98%    CONSTITUTIONAL: Well-appearing, appears mildly uncomfortable NEURO/PSYCH:  Alert and oriented x 3, no focal deficits EYES:  eyes equal and reactive ENT/NECK:  no LAD, no JVD CARDIO: Regular rate, well-perfused, normal S1 and S2 PULM:  CTAB no wheezing or rhonchi GI/GU:  non-distended, non-tender MSK/SPINE:  No gross deformities, no  edema SKIN:  no rash, atraumatic   *Additional and/or pertinent findings included in MDM below  Diagnostic and Interventional Summary    EKG Interpretation  Date/Time:  Wednesday February 26 2021 02:53:30 EST Ventricular Rate:  88 PR Interval:  146 QRS Duration: 103 QT Interval:  364 QTC Calculation: 441 R Axis:   58 Text Interpretation: Sinus rhythm Confirmed by Kennis Carina [phone]) on 02/26/2021 3:29:38 AM       Labs Reviewed  BASIC METABOLIC PANEL - Abnormal; Notable for the following components:      Result Value   Glucose, Bld 102 (*)    Calcium 8.6 (*)    All other components within normal limits  D-DIMER, QUANTITATIVE  CBC  HCG, QUANTITATIVE, PREGNANCY   TROPONIN I (HIGH SENSITIVITY)    DG Chest Port 1 View  Final Result      Medications  acetaminophen (TYLENOL) tablet 1,000 mg (1,000 mg Oral Given 02/26/21 0328)     Procedures  /  Critical Care Procedures  ED Course and Medical Decision Making  Initial Impression and Ddx DDx includes costochondritis, pneumothorax, felt to be low risk for PE.  Obtaining D-dimer, chest x-ray, EKG.  Past medical/surgical history that increases complexity of ED encounter: None  Interpretation of Diagnostics I personally reviewed the EKG and Chest Xray and my interpretation is as follows: Sinus rhythm, no concerning features, chest x-ray without obvious pneumothorax    Labs reveal negative troponin and D-dimer  Patient Reassessment and Ultimate Disposition/Management Patient looking and feeling well with normal vital signs, appropriate for discharge.  Patient management required discussion with the following services or consulting groups:  None  Complexity of Problems Addressed Acute illness or injury that poses threat of life of bodily function  Additional Data Reviewed and Analyzed Further history obtained from: Further history from spouse/family member  Additional Factors Impacting ED Encounter Risk None  Elmer Sow. Pilar Plate, MD [facility] Emergency Medicine [facility] [email or web address]  Final Clinical Impressions(s) / ED Diagnoses     ICD-10-CM   1. Chest pain, unspecified type  R07.9       ED Discharge Orders     None        Discharge Instructions Discussed with and Provided to Patient:     Discharge Instructions      You were evaluated in the Emergency Department and after careful evaluation, we did not find any emergent condition requiring admission or further testing in the hospital.  Your exam/testing today was overall reassuring.  Pain likely due to costochondritis.  Recommend Tylenol and/or Motrin every 4-6 hours at home for pain.  Please  return to the Emergency Department if you experience any worsening of your condition.  Thank you for allowing [place] to be a part of your care.        Sabas Sous, MD 02/26/21 [phone]

## 2021-02-26 NOTE — ED Triage Notes (Signed)
Pt c/o stabbing chest pain under left breast since last night.

## 2021-02-27 ENCOUNTER — Telehealth: Payer: Self-pay

## 2021-02-27 NOTE — Telephone Encounter (Signed)
Transition Care Management Unsuccessful Follow-up Telephone Call  Date of discharge and from where:  02/26/2021-[place]   Attempts:  1st Attempt  Reason for unsuccessful TCM follow-up call:  Left voice message

## 2021-02-28 NOTE — Telephone Encounter (Signed)
Transition Care Management Follow-up Telephone Call Date of discharge and from where: 02/26/2021-[place] How have you been since you were released from the hospital? Pt stated she is doing fine. Any questions or concerns? No  Items Reviewed: Did the pt receive and understand the discharge instructions provided? Yes  Medications obtained and verified? Yes  Other?  No medications given at discharge  Any new allergies since your discharge? No  Dietary orders reviewed? No Do you have support at home? Yes   Home Care and Equipment/Supplies: Were home health services ordered? not applicable If so, what is the name of the agency? N/A  Has the agency set up a time to come to the patient's home? not applicable Were any new equipment or medical supplies ordered?  No What is the name of the medical supply agency? N/A Were you able to get the supplies/equipment? not applicable Do you have any questions related to the use of the equipment or supplies? No  Functional Questionnaire: (I = Independent and D = Dependent) ADLs: I  Bathing/Dressing- I  Meal Prep- I  Eating- I  Maintaining continence- I  Transferring/Ambulation- I  Managing Meds- I  Follow up appointments reviewed:  PCP Hospital f/u appt confirmed? No   Specialist Hospital f/u appt confirmed? No   Are transportation arrangements needed? No  If their condition worsens, is the pt aware to call PCP or go to the Emergency Dept.? Yes Was the patient provided with contact information for the PCP's office or ED? Yes Was to pt encouraged to call back with questions or concerns? Yes

## 2021-03-19 ENCOUNTER — Encounter: Payer: Medicaid Other | Admitting: Adult Health

## 2021-10-28 DIAGNOSIS — R319 Hematuria, unspecified: Secondary | ICD-10-CM | POA: Diagnosis not present

## 2021-10-28 DIAGNOSIS — R8290 Unspecified abnormal findings in urine: Secondary | ICD-10-CM | POA: Diagnosis not present

## 2021-10-28 DIAGNOSIS — R109 Unspecified abdominal pain: Secondary | ICD-10-CM | POA: Diagnosis not present

## 2021-10-28 DIAGNOSIS — Z23 Encounter for immunization: Secondary | ICD-10-CM | POA: Diagnosis not present

## 2021-10-29 DIAGNOSIS — R109 Unspecified abdominal pain: Secondary | ICD-10-CM | POA: Diagnosis not present

## 2021-12-22 ENCOUNTER — Ambulatory Visit: Payer: Medicaid Other | Admitting: Obstetrics & Gynecology

## 2021-12-23 ENCOUNTER — Ambulatory Visit (INDEPENDENT_AMBULATORY_CARE_PROVIDER_SITE_OTHER): Payer: Medicaid Other | Admitting: Obstetrics & Gynecology

## 2021-12-23 ENCOUNTER — Other Ambulatory Visit (HOSPITAL_COMMUNITY)
Admission: RE | Admit: 2021-12-23 | Discharge: 2021-12-23 | Disposition: A | Discharge status: Home / Self Care | Payer: Medicaid Other | Source: Ambulatory Visit | Attending: Obstetrics & Gynecology | Admitting: Obstetrics & Gynecology

## 2021-12-23 ENCOUNTER — Encounter: Payer: Self-pay | Admitting: Obstetrics & Gynecology

## 2021-12-23 VITALS — BP 134/76 | HR 82 | Ht 62.0 in | Wt 212.0 lb

## 2021-12-23 DIAGNOSIS — Z113 Encounter for screening for infections with a predominantly sexual mode of transmission: Secondary | ICD-10-CM | POA: Diagnosis present

## 2021-12-23 DIAGNOSIS — Z01419 Encounter for gynecological examination (general) (routine) without abnormal findings: Secondary | ICD-10-CM

## 2021-12-23 NOTE — Progress Notes (Signed)
Subjective:     Michelle Larson is a 26 y.o. female here for a routine exam.  No LMP recorded. Patient has had an implant. [id number] Birth Control Method:  Nexplanon, planning to have it removed in the next couple of weeks Menstrual Calendar(currently): spotting frequently  Current complaints: none.   Current acute medical issues:     Recent Gynecologic History No LMP recorded. Patient has had an implant. Last Pap: 2021,  normal Last mammogram: never,    History reviewed. No pertinent past medical history.  Past Surgical History:  Procedure Laterality Date   CHOLECYSTECTOMY      OB History     Gravida  4   Para  3   Term  3   Preterm      AB  1   Living  3      SAB  1   IAB      Ectopic      Multiple  0   Live Births  3           Social History   Socioeconomic History   Marital status: Married    Spouse name: Not on file   Number of children: Not on file   Years of education: Not on file   Highest education level: Not on file  Occupational History   Not on file  Tobacco Use   Smoking status: Never   Smokeless tobacco: Never  Vaping Use   Vaping Use: Never used  Substance and Sexual Activity   Alcohol use: Not Currently   Drug use: No   Sexual activity: Yes    Birth control/protection: None, Coitus interruptus, Implant  Other Topics Concern   Not on file  Social History Narrative   Not on file   Social Determinants of Health   Financial Resource Strain: Low Risk  (10/28/2020)   Overall Financial Resource Strain (CARDIA)    Difficulty of Paying Living Expenses: Not hard at all  Food Insecurity: No Food Insecurity (10/28/2020)   Hunger Vital Sign    Worried About Running Out of Food in the Last Year: Never true    Ran Out of Food in the Last Year: Never true  Transportation Needs: No Transportation Needs (10/28/2020)   PRAPARE - Administrator, Civil Service (Medical): No    Lack of Transportation (Non-Medical): No   Physical Activity: Inactive (10/28/2020)   Exercise Vital Sign    Days of Exercise per Week: 0 days    Minutes of Exercise per Session: 0 min  Stress: No Stress Concern Present (10/28/2020)   [employer] of Occupational Health - Occupational Stress Questionnaire    Feeling of Stress : Not at all  Social Connections: Moderately Integrated (10/28/2020)   Social Connection and Isolation Panel [NHANES]    Frequency of Communication with Friends and Family: More than three times a week    Frequency of Social Gatherings with Friends and Family: More than three times a week    Attends Religious Services: More than 4 times per year    Active Member of [employer] or Organizations: No    Attends Banker Meetings: Never    Marital Status: Married    Family History  Problem Relation Age of Onset   Hyperlipidemia Maternal Grandmother    Diabetes Maternal Grandfather    Diabetes Mother     No current outpatient medications on file.  Review of Systems  Review of Systems  Constitutional: Negative for fever, chills, weight  loss, malaise/fatigue and diaphoresis.  HENT: Negative for hearing loss, ear pain, nosebleeds, congestion, sore throat, neck pain, tinnitus and ear discharge.   Eyes: Negative for blurred vision, double vision, photophobia, pain, discharge and redness.  Respiratory: Negative for cough, hemoptysis, sputum production, shortness of breath, wheezing and stridor.   Cardiovascular: Negative for chest pain, palpitations, orthopnea, claudication, leg swelling and PND.  Gastrointestinal: negative for abdominal pain. Negative for heartburn, nausea, vomiting, diarrhea, constipation, blood in stool and melena.  Genitourinary: Negative for dysuria, urgency, frequency, hematuria and flank pain.  Musculoskeletal: Negative for myalgias, back pain, joint pain and falls.  Skin: Negative for itching and rash.  Neurological: Negative for dizziness, tingling, tremors, sensory  change, speech change, focal weakness, seizures, loss of consciousness, weakness and headaches.  Endo/Heme/Allergies: Negative for environmental allergies and polydipsia. Does not bruise/bleed easily.  Psychiatric/Behavioral: Negative for depression, suicidal ideas, hallucinations, memory loss and substance abuse. The patient is not nervous/anxious and does not have insomnia.        Objective:  Blood pressure 134/76, pulse 82, height 5\' 2"  (1.575 m), weight 212 lb (96.2 kg).   Physical Exam  Vitals reviewed. Constitutional: She is oriented to person, place, and time. She appears well-developed and well-nourished.  HENT:  Head: Normocephalic and atraumatic.        Right Ear: External ear normal.  Left Ear: External ear normal.  Nose: Nose normal.  Mouth/Throat: Oropharynx is clear and moist.  Eyes: Conjunctivae and EOM are normal. Pupils are equal, round, and reactive to light. Right eye exhibits no discharge. Left eye exhibits no discharge. No scleral icterus.  Neck: Normal range of motion. Neck supple. No tracheal deviation present. No thyromegaly present.  Cardiovascular: Normal rate, regular rhythm, normal heart sounds and intact distal pulses.  Exam reveals no gallop and no friction rub.   No murmur heard. Respiratory: Effort normal and breath sounds normal. No respiratory distress. She has no wheezes. She has no rales. She exhibits no tenderness.  GI: Soft. Bowel sounds are normal. She exhibits no distension and no mass. There is no tenderness. There is no rebound and no guarding.  Genitourinary:  Breasts no masses skin changes or nipple changes bilaterally      Vulva is normal without lesions Vagina is pink moist without discharge Cervix normal in appearance and pap is done Uterus is normal size shape and contour Adnexa is negative with normal sized ovaries   Musculoskeletal: Normal range of motion. She exhibits no edema and no tenderness.  Neurological: She is alert and  oriented to person, place, and time. She has normal reflexes. She displays normal reflexes. No cranial nerve deficit. She exhibits normal muscle tone. Coordination normal.  Skin: Skin is warm and dry. No rash noted. No erythema. No pallor.  Psychiatric: She has a normal mood and affect. Her behavior is normal. Judgment and thought content normal.       Medications Ordered at today's visit: No orders of the defined types were placed in this encounter.   Other orders placed at today's visit: No orders of the defined types were placed in this encounter.     Assessment:    Normal Gyn exam.    Plan:    Contraception: Nexplanon. Follow up in: 1 year.     Return for keep scheduled.

## 2021-12-25 LAB — CERVICOVAGINAL ANCILLARY ONLY
Chlamydia: NEGATIVE
Comment: NEGATIVE
Comment: NORMAL
Neisseria Gonorrhea: NEGATIVE

## 2021-12-30 ENCOUNTER — Ambulatory Visit
Admission: RE | Admit: 2021-12-30 | Discharge: 2021-12-30 | Disposition: A | Discharge status: Home / Self Care | Payer: Medicaid Other | Source: Ambulatory Visit | Attending: Family Medicine | Admitting: Family Medicine

## 2021-12-30 VITALS — BP 107/73 | HR 97 | Temp 98.7°F | Resp 18

## 2021-12-30 DIAGNOSIS — J3489 Other specified disorders of nose and nasal sinuses: Secondary | ICD-10-CM

## 2021-12-30 DIAGNOSIS — H1033 Unspecified acute conjunctivitis, bilateral: Secondary | ICD-10-CM

## 2021-12-30 MED ORDER — FLUTICASONE PROPIONATE 50 MCG/ACT NA SUSP: 1.0000 | Freq: Two times a day (BID) | NASAL | 2 refills | Status: DC

## 2021-12-30 MED ORDER — POLYMYXIN B-TRIMETHOPRIM 10000-0.1 UNIT/ML-% OP SOLN: 1.0000 [drp] | Freq: Four times a day (QID) | OPHTHALMIC | 0 refills | Status: DC

## 2021-12-30 MED ORDER — PSEUDOEPHEDRINE HCL 30 MG PO TABS: 30.0000 mg | ORAL_TABLET | Freq: Four times a day (QID) | ORAL | 0 refills | Status: DC | PRN

## 2021-12-30 NOTE — ED Triage Notes (Signed)
Pt reports redness in eyes bilaterally x 3 days.  (Had a viral illness last week).  Reports discharge, crusted shut upon awakening, burning.

## 2021-12-30 NOTE — ED Provider Notes (Signed)
[facility]    CSN: [id number] Arrival date & time: 12/30/21  1501      History   Chief Complaint Chief Complaint  Patient presents with   Conjunctivitis    APPT 3:00     HPI Michelle Larson is a 26 y.o. female.   Presenting today with sinus pressure, bilateral eye redness, drainage, crusting, irritation, itching for the past 3 days.  States she just got over a cold last week and the symptoms are now presenting.  Denies fever, chills, visual change, headache, nausea, vomiting, injury to the eye.  Cleaning the eyes with make-up remover wipes daily to clear out the crusting, otherwise not trying thing for symptoms.  No known sick contacts recently.    History reviewed. No pertinent past medical history.  Patient Active Problem List   Diagnosis Date Noted   Nexplanon insertion 10/28/2020   Screen for STD (sexually transmitted disease) 10/28/2020   Pregnancy test negative 10/28/2020   DUB (dysfunctional uterine bleeding) 11/13/2019    Past Surgical History:  Procedure Laterality Date   CHOLECYSTECTOMY      OB History     Gravida  4   Para  3   Term  3   Preterm      AB  1   Living  3      SAB  1   IAB      Ectopic      Multiple  0   Live Births  3            Home Medications    Prior to Admission medications   Medication Sig Start Date End Date Taking? Authorizing Provider  fluticasone (FLONASE) 50 MCG/ACT nasal spray Place 1 spray into both nostrils 2 (two) times daily. 12/30/21  Yes Particia Nearing, PA-C  pseudoephedrine (SUDAFED) 30 MG tablet Take 1 tablet (30 mg total) by mouth every 6 (six) hours as needed for congestion. 12/30/21  Yes Particia Nearing, PA-C  trimethoprim-polymyxin b (POLYTRIM) ophthalmic solution Place 1 drop into both eyes every 6 (six) hours. 12/30/21  Yes Particia Nearing, PA-C    Family History Family History  Problem Relation Age of Onset   Hyperlipidemia Maternal Grandmother     Diabetes Maternal Grandfather    Diabetes Mother     Social History Social History   Tobacco Use   Smoking status: Never   Smokeless tobacco: Never  Vaping Use   Vaping Use: Never used  Substance Use Topics   Alcohol use: Not Currently   Drug use: No     Allergies   Patient has no known allergies.   Review of Systems Review of Systems Per HPI  Physical Exam Triage Vital Signs ED Triage Vitals  Enc Vitals Group     BP 12/30/21 1522 107/73     Pulse Rate 12/30/21 1522 97     Resp 12/30/21 1522 18     Temp 12/30/21 1522 98.7 F (37.1 C)     Temp Source 12/30/21 1522 Oral     SpO2 12/30/21 1522 98 %     Weight --      Height --      Head Circumference --      Peak Flow --      Pain Score 12/30/21 1521 5     Pain Loc --      Pain Edu? --      Excl. in GC? --    No data found.  Updated Vital  Signs BP 107/73 (BP Location: Right Arm)   Pulse 97   Temp 98.7 F (37.1 C) (Oral)   Resp 18   SpO2 98%   Visual Acuity Right Eye Distance:   Left Eye Distance:   Bilateral Distance:    Right Eye Near:   Left Eye Near:    Bilateral Near:     Physical Exam Vitals and nursing note reviewed.  Constitutional:      Appearance: Normal appearance.  HENT:     Head: Atraumatic.     Right Ear: Tympanic membrane and external ear normal.     Left Ear: Tympanic membrane and external ear normal.     Nose: Nose normal.     Mouth/Throat:     Mouth: Mucous membranes are moist.     Pharynx: No posterior oropharyngeal erythema.  Eyes:     Extraocular Movements: Extraocular movements intact.     Pupils: Pupils are equal, round, and reactive to light.     Comments: Bilateral conjunctival erythema, injection, crusting  Cardiovascular:     Rate and Rhythm: Normal rate and regular rhythm.     Heart sounds: Normal heart sounds.  Pulmonary:     Effort: Pulmonary effort is normal.     Breath sounds: Normal breath sounds. No wheezing.  Musculoskeletal:        General:  Normal range of motion.     Cervical back: Normal range of motion and neck supple.  Skin:    General: Skin is warm and dry.  Neurological:     Mental Status: She is alert and oriented to person, place, and time.     Motor: No weakness.     Gait: Gait normal.  Psychiatric:        Mood and Affect: Mood normal.        Thought Content: Thought content normal.      UC Treatments / Results  Labs (all labs ordered are listed, but only abnormal results are displayed) Labs Reviewed - No data to display  EKG   Radiology No results found.  Procedures Procedures (including critical care time)  Medications Ordered in UC Medications - No data to display  Initial Impression / Assessment and Plan / UC Course  I have reviewed the triage vital signs and the nursing notes.  Pertinent labs & imaging results that were available during my care of the patient were reviewed by me and considered in my medical decision making (see chart for details).     Treat with Sudafed, Flonase for the sinus pressure, Polytrim drops, warm compresses, good hand hygiene for the conjunctivitis.  Declines visual acuity as vision intact per patient.  Return for worsening symptoms.  Final Clinical Impressions(s) / UC Diagnoses   Final diagnoses:  Acute bacterial conjunctivitis of both eyes  Sinus pressure   Discharge Instructions   None    ED Prescriptions     Medication Sig Dispense Auth. Provider   pseudoephedrine (SUDAFED) 30 MG tablet Take 1 tablet (30 mg total) by mouth every 6 (six) hours as needed for congestion. 10 tablet Particia Nearing, PA-C   fluticasone [facility]) 50 MCG/ACT nasal spray Place 1 spray into both nostrils 2 (two) times daily. 16 g Particia Nearing, [place]   trimethoprim-polymyxin b [facility]) ophthalmic solution Place 1 drop into both eyes every 6 (six) hours. 10 mL Particia Nearing, [place]      PDMP not reviewed this encounter.   Particia Nearing,  [place] 12/30/21 [phone]

## 2022-01-08 ENCOUNTER — Encounter: Payer: Self-pay | Admitting: Obstetrics & Gynecology

## 2022-01-08 ENCOUNTER — Ambulatory Visit (INDEPENDENT_AMBULATORY_CARE_PROVIDER_SITE_OTHER): Payer: Medicaid Other | Admitting: Obstetrics & Gynecology

## 2022-01-08 VITALS — BP 117/74 | HR 82 | Ht 62.0 in | Wt 208.0 lb

## 2022-01-08 DIAGNOSIS — Z3046 Encounter for surveillance of implantable subdermal contraceptive: Secondary | ICD-10-CM

## 2022-01-08 NOTE — Progress Notes (Signed)
Procedure note: removal The left arm is prepped in usual sterile fashion Received 3 cc of half percent Marcaine is injected at the site of the Nexplanon insertion The Nexplanon is palpated without difficulty A small stab incision is made with an 11 blade Curved hemostats were used and the Nexplanon rod was removed without difficulty     Hemostasis is achieved  Steri-Strips were placed in elastic bandage placed which remain in place for 2 days   Michelle Larson 01/08/2022 3:26 PM

## 2022-05-08 ENCOUNTER — Emergency Department (HOSPITAL_COMMUNITY): Payer: Medicaid Other

## 2022-05-08 ENCOUNTER — Encounter (HOSPITAL_COMMUNITY): Payer: Self-pay

## 2022-05-08 ENCOUNTER — Other Ambulatory Visit: Payer: Self-pay

## 2022-05-08 ENCOUNTER — Emergency Department (HOSPITAL_COMMUNITY)
Admission: EM | Admit: 2022-05-08 | Discharge: 2022-05-09 | Disposition: A | Discharge status: Home / Self Care | Payer: Self-pay | Attending: Emergency Medicine | Admitting: Emergency Medicine

## 2022-05-08 DIAGNOSIS — T7840XA Allergy, unspecified, initial encounter: Secondary | ICD-10-CM | POA: Insufficient documentation

## 2022-05-08 DIAGNOSIS — R09A2 Foreign body sensation, throat: Secondary | ICD-10-CM | POA: Insufficient documentation

## 2022-05-08 MED ORDER — PSEUDOEPHEDRINE HCL 60 MG PO TABS
120.0000 mg | ORAL_TABLET | Freq: Two times a day (BID) | ORAL | Status: DC
Start: 2022-05-08 — End: 2022-05-09
  Administered 2022-05-08: 120 mg via ORAL
  Filled 2022-05-08: qty 2

## 2022-05-08 MED ORDER — DEXAMETHASONE 4 MG PO TABS
10.0000 mg | ORAL_TABLET | Freq: Once | ORAL | Status: AC
  Administered 2022-05-09: 10 mg via ORAL
  Filled 2022-05-08: qty 3

## 2022-05-08 MED ORDER — LIDOCAINE VISCOUS HCL 2 % MT SOLN
15.0000 mL | Freq: Once | OROMUCOSAL | Status: AC
  Administered 2022-05-08: 15 mL via OROMUCOSAL
  Filled 2022-05-08: qty 15

## 2022-05-08 MED ORDER — LORATADINE 10 MG PO TABS
10.0000 mg | ORAL_TABLET | Freq: Every day | ORAL | Status: DC
  Administered 2022-05-08: 10 mg via ORAL
  Filled 2022-05-08: qty 1

## 2022-05-08 NOTE — ED Triage Notes (Addendum)
Pt states she was eating sub yesterday and accidentally swallowed the piece of wrapping paper on the sub. States she can feel  the paper stuck in her throat and has been trying to throw it up.   Pt allergies also acting up.

## 2022-05-09 NOTE — ED Provider Notes (Signed)
[facility] Provider Note   CSN: [id number] Arrival date & time: 05/08/22  2115     History  Chief Complaint  Patient presents with   Swallowed Foreign Body    Michelle Larson is a 27 y.o. female.  27 yo F here with foreign body sensation in her throat. Started yesterday. Thinks it might be a piece of paper from her sub that she ate recently. Also has had allergies with cough, rhinnorhea, sore throat. No fevers.    Swallowed Foreign Body       Home Medications Prior to Admission medications   Medication Sig Start Date End Date Taking? Authorizing Provider  fluticasone (FLONASE) 50 MCG/ACT nasal spray Place 1 spray into both nostrils 2 (two) times daily. 12/30/21   Particia Nearing, PA-C  trimethoprim-polymyxin b (POLYTRIM) ophthalmic solution Place 1 drop into both eyes every 6 (six) hours. 12/30/21   Particia Nearing, PA-C      Allergies    Patient has no known allergies.    Review of Systems   Review of Systems  Physical Exam Updated Vital Signs BP (!) 115/56   Pulse 86   Temp 98.8 F (37.1 C)   Resp 14   Wt 91.6 kg   SpO2 99%   BMI 36.95 kg/m  Physical Exam Vitals and nursing note reviewed.  Constitutional:      Appearance: She is well-developed.  HENT:     Head: Normocephalic and atraumatic.     Comments: Used glidescope to visualize upper glottic area/epiglotis without e/o foreign body. Mild edema and erythema noted.     Nose: Congestion and rhinorrhea present.     Mouth/Throat:     Mouth: Mucous membranes are moist.     Pharynx: Posterior oropharyngeal erythema present.  Cardiovascular:     Rate and Rhythm: Normal rate and regular rhythm.  Pulmonary:     Effort: No respiratory distress.     Breath sounds: No stridor.  Abdominal:     General: There is no distension.  Musculoskeletal:     Cervical back: Normal range of motion.  Skin:    General: Skin is warm and dry.  Neurological:      General: No focal deficit present.     Mental Status: She is alert.     ED Results / Procedures / Treatments   Labs (all labs ordered are listed, but only abnormal results are displayed) Labs Reviewed - No data to display  EKG None  Radiology DG Neck Soft Tissue  Result Date: 05/08/2022 CLINICAL DATA:  Evaluate for foreign body.  Ingested a wrapper. EXAM: NECK SOFT TISSUES - 1+ VIEW COMPARISON:  None Available. FINDINGS: There is no evidence of retropharyngeal soft tissue swelling or epiglottic enlargement. Mild soft tissue fullness in the supraglottic region may be related to patient rotation/projection. The cervical airway is otherwise unremarkable and no radio-opaque foreign body identified. IMPRESSION: 1. No radiopaque foreign body identified. 2. Mild soft tissue fullness in the supraglottic region may be related to patient rotation/projection. Please correlate clinically. Electronically Signed   By: Darliss Cheney M.D.   On: 05/08/2022 23:53    Procedures Procedures    Medications Ordered in ED Medications  loratadine (CLARITIN) tablet 10 mg (10 mg Oral Given 05/08/22 2351)    And  pseudoephedrine (SUDAFED) tablet 120 mg (120 mg Oral Given 05/08/22 2351)  lidocaine (XYLOCAINE) 2 % viscous mouth solution 15 mL (15 mLs Mouth/Throat Given 05/08/22 2352)  dexamethasone (DECADRON)  tablet 10 mg (10 mg Oral Given 05/09/22 0015)    ED Course/ Medical Decision Making/ A&P                             Medical Decision Making Amount and/or Complexity of Data Reviewed Radiology: ordered.  Risk OTC drugs. Prescription drug management.   Suspect allergies and edema as the cause for her foreign body sensation. Low suspicion for foreign body, especially a piece of paper. Nothing radioopaque on my interpretation of neck xr.    Final Clinical Impression(s) / ED Diagnoses Final diagnoses:  Sensation of foreign body in larynx  Allergy, initial encounter    Rx / DC Orders ED Discharge  Orders     None         Jode Lippe, Barbara Cower, MD 05/09/22 0028

## 2022-11-16 ENCOUNTER — Ambulatory Visit (INDEPENDENT_AMBULATORY_CARE_PROVIDER_SITE_OTHER): Payer: 59

## 2022-11-16 VITALS — BP 103/68 | HR 72 | Ht 62.0 in | Wt 199.0 lb

## 2022-11-16 DIAGNOSIS — N926 Irregular menstruation, unspecified: Secondary | ICD-10-CM

## 2022-11-16 DIAGNOSIS — Z3201 Encounter for pregnancy test, result positive: Secondary | ICD-10-CM

## 2022-11-16 LAB — POCT URINE PREGNANCY: Preg Test, Ur: POSITIVE — AB

## 2022-11-16 NOTE — Progress Notes (Signed)
   NURSE VISIT- PREGNANCY CONFIRMATION   SUBJECTIVE:  Michelle Larson is a 27 y.o. [phone] female at [redacted]w[redacted]d by certain LMP of Patient's last menstrual period was 09/02/2022 (approximate). Here for pregnancy confirmation.  Home pregnancy test: positive x 4   She reports no complaints.  She is not taking prenatal vitamins.    OBJECTIVE:  BP 103/68 (BP Location: Right Arm, Patient Position: Sitting, Cuff Size: Normal)   Pulse 72   Ht 5\' 2"  (1.575 m)   Wt 199 lb (90.3 kg)   LMP 09/02/2022 (Approximate)   BMI 36.40 kg/m   Appears well, in no apparent distress  Results for orders placed or performed in visit on 11/16/22 (from the past 24 hour(s))  POCT urine pregnancy   Collection Time: 11/16/22  1:55 PM  Result Value Ref Range   Preg Test, Ur Positive (A) Negative    ASSESSMENT: Positive pregnancy test, [redacted]w[redacted]d by LMP    PLAN: Schedule for dating ultrasound in next available dating u/s Prenatal vitamins: plans to begin OTC ASAP   Nausea medicines: not currently needed   OB packet given: Yes  Jobe Marker  11/16/2022 1:59 PM

## 2022-11-17 ENCOUNTER — Ambulatory Visit (INDEPENDENT_AMBULATORY_CARE_PROVIDER_SITE_OTHER): Payer: 59 | Admitting: Radiology

## 2022-11-17 ENCOUNTER — Other Ambulatory Visit: Payer: Self-pay | Admitting: Obstetrics & Gynecology

## 2022-11-17 DIAGNOSIS — O26841 Uterine size-date discrepancy, first trimester: Secondary | ICD-10-CM | POA: Diagnosis not present

## 2022-11-17 DIAGNOSIS — O3680X0 Pregnancy with inconclusive fetal viability, not applicable or unspecified: Secondary | ICD-10-CM

## 2022-11-17 DIAGNOSIS — Z3A10 10 weeks gestation of pregnancy: Secondary | ICD-10-CM

## 2022-11-17 NOTE — Progress Notes (Signed)
GA = 10+6 by known LMP (09-02-22)  (Nexplanon removed 5 months ago)  Retroverted uterus with single gestational sac intact within upper mid fundal cavity GS = 9.84 mm correlates to 5+5 weeks    size < dates    no apparent abnormalities YS = 2.3 mm Normal left ovary with corpus luteal = 25 x 22 mm Normal Rt ov   -  Neg and regions  -  neg CDS  -  tiny amount of clear free fluid present Cervix long and closed

## 2022-11-23 ENCOUNTER — Telehealth: Payer: Self-pay | Admitting: *Deleted

## 2022-11-23 ENCOUNTER — Encounter: Payer: Self-pay | Admitting: *Deleted

## 2022-11-23 NOTE — Telephone Encounter (Signed)
Pt called with complaints of being constipated and hemorrhoids. Pt was advised can eat 4 prunes daily or drink prune juice. I sent a mychart message with safe meds during pregnancy with what she can try for hemorrhoids. Pt was advised if this don't help and she feels like the hemorrhoid is getting worse, let us know. Pt voiced understanding. JSY

## 2022-12-31 ENCOUNTER — Encounter: Payer: Self-pay | Admitting: *Deleted

## 2022-12-31 DIAGNOSIS — Z3490 Encounter for supervision of normal pregnancy, unspecified, unspecified trimester: Secondary | ICD-10-CM | POA: Insufficient documentation

## 2023-01-04 ENCOUNTER — Other Ambulatory Visit: Payer: Self-pay | Admitting: Obstetrics & Gynecology

## 2023-01-04 DIAGNOSIS — Z3682 Encounter for antenatal screening for nuchal translucency: Secondary | ICD-10-CM

## 2023-01-05 ENCOUNTER — Encounter: Payer: 59 | Admitting: *Deleted

## 2023-01-05 ENCOUNTER — Encounter: Payer: Self-pay | Admitting: Women's Health

## 2023-01-05 ENCOUNTER — Other Ambulatory Visit: Payer: 59

## 2023-01-05 ENCOUNTER — Ambulatory Visit: Payer: 59 | Admitting: Women's Health

## 2023-01-05 VITALS — BP 97/53 | HR 69 | Wt 202.6 lb

## 2023-01-05 DIAGNOSIS — Z8759 Personal history of other complications of pregnancy, childbirth and the puerperium: Secondary | ICD-10-CM | POA: Insufficient documentation

## 2023-01-05 DIAGNOSIS — Z3682 Encounter for antenatal screening for nuchal translucency: Secondary | ICD-10-CM

## 2023-01-05 DIAGNOSIS — Z131 Encounter for screening for diabetes mellitus: Secondary | ICD-10-CM | POA: Diagnosis not present

## 2023-01-05 DIAGNOSIS — Z3A12 12 weeks gestation of pregnancy: Secondary | ICD-10-CM

## 2023-01-05 DIAGNOSIS — Z3481 Encounter for supervision of other normal pregnancy, first trimester: Secondary | ICD-10-CM

## 2023-01-05 DIAGNOSIS — Z3480 Encounter for supervision of other normal pregnancy, unspecified trimester: Secondary | ICD-10-CM

## 2023-01-05 DIAGNOSIS — Z6836 Body mass index (BMI) 36.0-36.9, adult: Secondary | ICD-10-CM

## 2023-01-05 DIAGNOSIS — Z23 Encounter for immunization: Secondary | ICD-10-CM

## 2023-01-05 MED ORDER — DOXYLAMINE-PYRIDOXINE 10-10 MG PO TBEC: DELAYED_RELEASE_TABLET | ORAL | 6 refills | Status: DC

## 2023-01-05 NOTE — Progress Notes (Signed)
 INITIAL OBSTETRICAL VISIT Patient name: Michelle Larson MRN [id number]  Date of birth: [birth year] Chief Complaint:   Initial Prenatal Visit  History of Present Illness:   Michelle Larson is a 27 y.o. [phone] Hispanic female at [redacted]w[redacted]d by US  at 5 weeks with an Estimated Date of Delivery: 07/15/23 being seen today for her initial obstetrical visit.   Patient's last menstrual period was 09/02/2022 (approximate). Her obstetrical history is significant for  SAB x 1, term SVB x 3, PPH (pit, cytotec ) .   Today she reports  n/v- requests rx .  Last pap 03/26/20. Results were: NILM w/ HRHPV negative     01/05/2023    2:57 PM 10/28/2020    9:15 AM 12/07/2018    2:50 PM  Depression screen PHQ 2/9  Decreased Interest 0 0 0  Down, Depressed, Hopeless 0 0 0  PHQ - 2 Score 0 0 0  Altered sleeping 1    Tired, decreased energy 1    Change in appetite 0    Feeling bad or failure about yourself  0    Trouble concentrating 0    Moving slowly or fidgety/restless 0    Suicidal thoughts 0    PHQ-9 Score 2          01/05/2023    2:57 PM  GAD 7 : Generalized Anxiety Score  Nervous, Anxious, on Edge 0  Control/stop worrying 0  Worry too much - different things 0  Trouble relaxing 0  Restless 0  Easily annoyed or irritable 1  Afraid - awful might happen 0  Total GAD 7 Score 1     Review of Systems:   Pertinent items are noted in HPI Denies cramping/contractions, leakage of fluid, vaginal bleeding, abnormal vaginal discharge w/ itching/odor/irritation, headaches, visual changes, shortness of breath, chest pain, abdominal pain, severe nausea/vomiting, or problems with urination or bowel movements unless otherwise stated above.  Pertinent History Reviewed:  Reviewed past medical,surgical, social, obstetrical and family history.  Reviewed problem list, medications and allergies. OB History  Gravida Para Term Preterm AB Living  5 3 3  1 3   SAB IAB Ectopic Multiple Live Births  1    0 3    # Outcome Date GA Lbr Len/2nd Weight Sex Type Anes PTL Lv  5 Current           4 Term 05/31/19 [redacted]w[redacted]d 09:31 / 00:01 9 lb 7 oz (4.281 kg) M Vag-Spont None  LIV  3 SAB 06/2018          2 Term 02/18/16 [redacted]w[redacted]d  8 lb 7 oz (3.827 kg) M Vag-Spont  N LIV  1 Term 08/25/12 [redacted]w[redacted]d  8 lb 12 oz (3.969 kg) F Vag-Spont  N LIV   Physical Assessment:   Vitals:   01/05/23 1454  BP: (!) 97/53  Pulse: 69  Weight: 202 lb 9.6 oz (91.9 kg)  Body mass index is 37.06 kg/m.       Physical Examination:  General appearance - well appearing, and in no distress  Mental status - alert, oriented to person, place, and time  Psych:  She has a normal mood and affect  Skin - warm and dry, normal color, no suspicious lesions noted  Chest - effort normal, all lung fields clear to auscultation bilaterally  Heart - normal rate and regular rhythm  Abdomen - soft, nontender  Extremities:  No swelling or varicosities noted  Thin prep pap is not done   Chaperone: N/A  TODAY'S NT US  12+5 wks,measurements c/w dates,CRL 64.13 mm,NT 1.7 mm,NB present,anterior placenta,normal ovaries,FHR 153 bpm   No results found for this or any previous visit (from the past 24 hours).  Assessment & Plan:  1) Low-Risk Pregnancy [id number] at [redacted]w[redacted]d with an Estimated Date of Delivery: 07/15/23   2) Initial OB visit  3) H/O PPH  4) N/V> rx diclegis   Meds: No orders of the defined types were placed in this encounter.   Initial labs obtained Continue prenatal vitamins Reviewed n/v relief measures and warning s/s to report Reviewed recommended weight gain based on pre-gravid BMI Encouraged well-balanced diet Genetic & carrier screening discussed: requests Panorama, NT/IT, and Horizon  Ultrasound discussed; fetal survey: requested CCNC completed> form faxed if has or is planning to apply for medicaid The nature of [facility] [employer] with multiple MDs and other Advanced Practice Providers was explained to  patient; also emphasized that fellows, residents, and students are part of our team. Does not have home bp cuff. Office bp cuff given: yes. Rx sent: no. Check bp weekly, let us  know if consistently >140/90.  Flu shot today   Follow-up: Return in about 4 weeks (around 02/02/2023) for LROB, 2nd IT, CNM, in person; then 8wks from now anatomy u/s and LROB w/ CNM.   Orders Placed This Encounter  Procedures   Urine Culture   GC/Chlamydia Probe Amp   Integrated 1   CBC/D/Plt+RPR+Rh+ABO+RubIgG...   PANORAMA PRENATAL TEST   HORIZON CUSTOM   Hemoglobin A1c    Suzen JONELLE Fetters CNM, [facility] 01/05/2023 3:31 PM

## 2023-01-05 NOTE — Progress Notes (Signed)
[place] 12+5 wks,measurements c/w dates,CRL 64.13 mm,NT 1.7 mm,NB present,anterior placenta,normal ovaries,FHR 153 bpm

## 2023-01-05 NOTE — Patient Instructions (Addendum)
 Michelle Larson, thank you for choosing our office today! We appreciate the opportunity to meet your healthcare needs. You may receive a short survey by mail, e-mail, or through [employer]. If you are happy with your care we would appreciate if you could take just a few minutes to complete the survey questions. We read all of your comments and take your feedback very seriously. Thank you again for choosing our office.  Center for [employer] [facility] Team at [facility]  [facility] at [facility] ([address] [place], [place] [address]) Entrance C, located off of [address] Free 24/7 valet parking   Preparation H for hemorrhoids  Nausea & Vomiting Have saltine crackers or pretzels by your bed and eat a few bites before you raise your head out of bed in the morning Eat small frequent meals throughout the day instead of large meals Drink plenty of fluids throughout the day to stay hydrated, just don't drink a lot of fluids with your meals.  This can make your stomach fill up faster making you feel sick Do not brush your teeth right after you eat Products with real ginger are good for nausea, like ginger ale and ginger hard candy Make sure it says made with real ginger! Sucking on sour candy like lemon heads is also good for nausea If your prenatal vitamins make you nauseated, take them at night so you will sleep through the nausea Sea Bands If you feel like you need medicine for the nausea & vomiting please let us  know If you are unable to keep any fluids or food down please let us  know   Constipation Drink plenty of fluid, preferably water, throughout the day Eat foods high in fiber such as fruits, vegetables, and grains Exercise, such as walking, is a good way to keep your bowels regular Drink warm fluids, especially warm prune juice, or decaf coffee Eat a 1/2 cup of real oatmeal (not instant), 1/2 cup applesauce, and 1/2-1 cup warm prune juice every day If needed, you may take  Colace (docusate sodium ) stool softener once or twice a day to help keep the stool soft.  If you still are having problems with constipation, you may take Miralax once daily as needed to help keep your bowels regular.   Home Blood Pressure Monitoring for Patients   Your provider has recommended that you check your blood pressure (BP) at least once a week at home. If you do not have a blood pressure cuff at home, one will be provided for you. Contact your provider if you have not received your monitor within 1 week.   Helpful Tips for Accurate Home Blood Pressure Checks  Don't smoke, exercise, or drink caffeine 30 minutes before checking your BP Use the restroom before checking your BP (a full bladder can raise your pressure) Relax in a comfortable upright chair Feet on the ground Left arm resting comfortably on a flat surface at the level of your heart Legs uncrossed Back supported Sit quietly and don't talk Place the cuff on your bare arm Adjust snuggly, so that only two fingertips can fit between your skin and the top of the cuff Check 2 readings separated by at least one minute Keep a log of your BP readings For a visual, please reference this diagram: http://ccnc.care/bpdiagram  Provider Name: [facility]     Phone: [phone]  Zone 1: ALL CLEAR  Continue to monitor your symptoms:  BP reading is less than 140 (top number)  or less than 90 (bottom number)  No right upper stomach pain No headaches or seeing spots No feeling nauseated or throwing up No swelling in face and hands  Zone 2: CAUTION Call your doctor's office for any of the following:  BP reading is greater than 140 (top number) or greater than 90 (bottom number)  Stomach pain under your ribs in the middle or right side Headaches or seeing spots Feeling nauseated or throwing up Swelling in face and hands  Zone 3: EMERGENCY  Seek immediate medical care if you have any of the following:  BP reading is  greater than160 (top number) or greater than 110 (bottom number) Severe headaches not improving with Tylenol  Serious difficulty catching your breath Any worsening symptoms from Zone 2    First Trimester of Pregnancy The first trimester of pregnancy is from week 1 until the end of week 12 (months 1 through 3). A week after a sperm fertilizes an egg, the egg will implant on the wall of the uterus. This embryo will begin to develop into a baby. Genes from you and your partner are forming the baby. The female genes determine whether the baby is a boy or a girl. At 6-8 weeks, the eyes and face are formed, and the heartbeat can be seen on ultrasound. At the end of 12 weeks, all the baby's organs are formed.  Now that you are pregnant, you will want to do everything you can to have a healthy baby. Two of the most important things are to get good prenatal care and to follow your health care provider's instructions. Prenatal care is all the medical care you receive before the baby's birth. This care will help prevent, find, and treat any problems during the pregnancy and childbirth. BODY CHANGES Your body goes through many changes during pregnancy. The changes vary from woman to woman.  You may gain or lose a couple of pounds at first. You may feel sick to your stomach (nauseous) and throw up (vomit). If the vomiting is uncontrollable, call your health care provider. You may tire easily. You may develop headaches that can be relieved by medicines approved by your health care provider. You may urinate more often. Painful urination may mean you have a bladder infection. You may develop heartburn as a result of your pregnancy. You may develop constipation because certain hormones are causing the muscles that push waste through your intestines to slow down. You may develop hemorrhoids or swollen, bulging veins (varicose veins). Your breasts may begin to grow larger and become tender. Your nipples may stick out  more, and the tissue that surrounds them (areola) may become darker. Your gums may bleed and may be sensitive to brushing and flossing. Dark spots or blotches (chloasma, mask of pregnancy) may develop on your face. This will likely fade after the baby is born. Your menstrual periods will stop. You may have a loss of appetite. You may develop cravings for certain kinds of food. You may have changes in your emotions from day to day, such as being excited to be pregnant or being concerned that something may go wrong with the pregnancy and baby. You may have more vivid and strange dreams. You may have changes in your hair. These can include thickening of your hair, rapid growth, and changes in texture. Some women also have hair loss during or after pregnancy, or hair that feels dry or thin. Your hair will most likely return to normal after your baby is born. WHAT  TO EXPECT AT YOUR PRENATAL VISITS During a routine prenatal visit: You will be weighed to make sure you and the baby are growing normally. Your blood pressure will be taken. Your abdomen will be measured to track your baby's growth. The fetal heartbeat will be listened to starting around week 10 or 12 of your pregnancy. Test results from any previous visits will be discussed. Your health care provider may ask you: How you are feeling. If you are feeling the baby move. If you have had any abnormal symptoms, such as leaking fluid, bleeding, severe headaches, or abdominal cramping. If you have any questions. Other tests that may be performed during your first trimester include: Blood tests to find your blood type and to check for the presence of any previous infections. They will also be used to check for low iron levels (anemia) and Rh antibodies. Later in the pregnancy, blood tests for diabetes will be done along with other tests if problems develop. Urine tests to check for infections, diabetes, or protein in the urine. An ultrasound to  confirm the proper growth and development of the baby. An amniocentesis to check for possible genetic problems. Fetal screens for spina bifida and Down syndrome. You may need other tests to make sure you and the baby are doing well. HOME CARE INSTRUCTIONS  Medicines Follow your health care provider's instructions regarding medicine use. Specific medicines may be either safe or unsafe to take during pregnancy. Take your prenatal vitamins as directed. If you develop constipation, try taking a stool softener if your health care provider approves. Diet Eat regular, well-balanced meals. Choose a variety of foods, such as meat or vegetable-based protein, fish, milk and low-fat dairy products, vegetables, fruits, and whole grain breads and cereals. Your health care provider will help you determine the amount of weight gain that is right for you. Avoid raw meat and uncooked cheese. These carry germs that can cause birth defects in the baby. Eating four or five small meals rather than three large meals a day may help relieve nausea and vomiting. If you start to feel nauseous, eating a few soda crackers can be helpful. Drinking liquids between meals instead of during meals also seems to help nausea and vomiting. If you develop constipation, eat more high-fiber foods, such as fresh vegetables or fruit and whole grains. Drink enough fluids to keep your urine clear or pale yellow. Activity and Exercise Exercise only as directed by your health care provider. Exercising will help you: Control your weight. Stay in shape. Be prepared for labor and delivery. Experiencing pain or cramping in the lower abdomen or low back is a good sign that you should stop exercising. Check with your health care provider before continuing normal exercises. Try to avoid standing for long periods of time. Move your legs often if you must stand in one place for a long time. Avoid heavy lifting. Wear low-heeled shoes, and practice  good posture. You may continue to have sex unless your health care provider directs you otherwise. Relief of Pain or Discomfort Wear a good support bra for breast tenderness.   Take warm sitz baths to soothe any pain or discomfort caused by hemorrhoids. Use hemorrhoid cream if your health care provider approves.   Rest with your legs elevated if you have leg cramps or low back pain. If you develop varicose veins in your legs, wear support hose. Elevate your feet for 15 minutes, 3-4 times a day. Limit salt in your diet. Prenatal Care Schedule  your prenatal visits by the twelfth week of pregnancy. They are usually scheduled monthly at first, then more often in the last 2 months before delivery. Write down your questions. Take them to your prenatal visits. Keep all your prenatal visits as directed by your health care provider. Safety Wear your seat belt at all times when driving. Make a list of emergency phone numbers, including numbers for family, friends, the hospital, and police and fire departments. General Tips Ask your health care provider for a referral to a local prenatal education class. Begin classes no later than at the beginning of month 6 of your pregnancy. Ask for help if you have counseling or nutritional needs during pregnancy. Your health care provider can offer advice or refer you to specialists for help with various needs. Do not use hot tubs, steam rooms, or saunas. Do not douche or use tampons or scented sanitary pads. Do not cross your legs for long periods of time. Avoid cat litter boxes and soil used by cats. These carry germs that can cause birth defects in the baby and possibly loss of the fetus by miscarriage or stillbirth. Avoid all smoking, herbs, alcohol, and medicines not prescribed by your health care provider. Chemicals in these affect the formation and growth of the baby. Schedule a dentist appointment. At home, brush your teeth with a soft toothbrush and be  gentle when you floss. SEEK MEDICAL CARE IF:  You have dizziness. You have mild pelvic cramps, pelvic pressure, or nagging pain in the abdominal area. You have persistent nausea, vomiting, or diarrhea. You have a bad smelling vaginal discharge. You have pain with urination. You notice increased swelling in your face, hands, legs, or ankles. SEEK IMMEDIATE MEDICAL CARE IF:  You have a fever. You are leaking fluid from your vagina. You have spotting or bleeding from your vagina. You have severe abdominal cramping or pain. You have rapid weight gain or loss. You vomit blood or material that looks like coffee grounds. You are exposed to German measles and have never had them. You are exposed to fifth disease or chickenpox. You develop a severe headache. You have shortness of breath. You have any kind of trauma, such as from a fall or a car accident. Document Released: 12/16/2000 Document Revised: 05/08/2013 Document Reviewed: 11/01/2012 [facility] Patient Information 2015 [place], [place]. This information is not intended to replace advice given to you by your health care provider. Make sure you discuss any questions you have with your health care provider.

## 2023-01-06 NOTE — L&D Delivery Note (Signed)
 Delivery Note At 7:45 AM a viable and healthy female was delivered via Vaginal, Spontaneous (Presentation:   Occiput Anterior).  APGAR: 9, 9; weight  .   Placenta status: Spontaneous, Intact.  Cord: 3 vessels with the following complications: None.    Anesthesia: None Episiotomy: None Lacerations: None Suture Repair: none Est. Blood Loss (mL):  300  Mom to postpartum.  Baby to Couplet care / Skin to Skin.  [name] 07/14/2023, 7:59 AM

## 2023-01-07 LAB — GC/CHLAMYDIA PROBE AMP
Chlamydia trachomatis, NAA: NEGATIVE
Neisseria Gonorrhoeae by PCR: NEGATIVE

## 2023-01-08 ENCOUNTER — Other Ambulatory Visit: Payer: Self-pay | Admitting: Adult Health

## 2023-01-08 LAB — INTEGRATED 1
Crown Rump Length: 64.1 mm
Gest. Age on Collection Date: 12.6 wk
Maternal Age at EDD: 29.5 a
Nuchal Translucency (NT): 1.7 mm
Number of Fetuses: 1
PAPP-A Value: 433.9 ng/mL
Sonographer ID#: 309760
Weight: 203 [lb_av]

## 2023-01-08 LAB — CBC/D/PLT+RPR+RH+ABO+RUBIGG...
Antibody Screen: NEGATIVE
Basophils Absolute: 0 10*3/uL (ref 0.0–0.2)
Basos: 0 %
EOS (ABSOLUTE): 0.1 10*3/uL (ref 0.0–0.4)
Eos: 1 %
HCV Ab: NONREACTIVE
HIV Screen 4th Generation wRfx: NONREACTIVE
Hematocrit: 42.1 % (ref 34.0–46.6)
Hemoglobin: 14.1 g/dL (ref 11.1–15.9)
Hepatitis B Surface Ag: NEGATIVE
Immature Grans (Abs): 0 10*3/uL (ref 0.0–0.1)
Immature Granulocytes: 0 %
Lymphocytes Absolute: 2.4 10*3/uL (ref 0.7–3.1)
Lymphs: 25 %
MCH: 31.6 pg (ref 26.6–33.0)
MCHC: 33.5 g/dL (ref 31.5–35.7)
MCV: 94 fL (ref 79–97)
Monocytes Absolute: 0.5 10*3/uL (ref 0.1–0.9)
Monocytes: 5 %
Neutrophils Absolute: 6.4 10*3/uL (ref 1.4–7.0)
Neutrophils: 69 %
Platelets: 235 10*3/uL (ref 150–450)
RBC: 4.46 x10E6/uL (ref 3.77–5.28)
RDW: 12.6 % (ref 11.7–15.4)
RPR Ser Ql: NONREACTIVE
Rh Factor: POSITIVE
Rubella Antibodies, IGG: 1.42 {index} (ref >0.99)
WBC: 9.4 10*3/uL (ref 3.4–10.8)

## 2023-01-08 LAB — HEMOGLOBIN A1C
Est. average glucose Bld gHb Est-mCnc: 105 mg/dL
Hgb A1c MFr Bld: 5.3 % (ref 4.8–5.6)

## 2023-01-08 LAB — URINE CULTURE

## 2023-01-08 LAB — HCV INTERPRETATION

## 2023-01-08 MED ORDER — CEPHALEXIN 500 MG PO CAPS: 500.0000 mg | ORAL_CAPSULE | Freq: Three times a day (TID) | ORAL | 0 refills | Status: DC

## 2023-01-13 LAB — PANORAMA PRENATAL TEST FULL PANEL:PANORAMA TEST PLUS 5 ADDITIONAL MICRODELETIONS: FETAL FRACTION: 6.1

## 2023-01-14 LAB — HORIZON CUSTOM: REPORT SUMMARY: NEGATIVE

## 2023-02-02 ENCOUNTER — Encounter: Payer: Self-pay | Admitting: Women's Health

## 2023-02-02 ENCOUNTER — Ambulatory Visit (INDEPENDENT_AMBULATORY_CARE_PROVIDER_SITE_OTHER): Payer: Self-pay | Admitting: Women's Health

## 2023-02-02 VITALS — BP 101/60 | HR 84 | Wt 203.0 lb

## 2023-02-02 DIAGNOSIS — Z1379 Encounter for other screening for genetic and chromosomal anomalies: Secondary | ICD-10-CM

## 2023-02-02 DIAGNOSIS — Z6836 Body mass index (BMI) 36.0-36.9, adult: Secondary | ICD-10-CM | POA: Insufficient documentation

## 2023-02-02 DIAGNOSIS — Z363 Encounter for antenatal screening for malformations: Secondary | ICD-10-CM

## 2023-02-02 DIAGNOSIS — R8271 Bacteriuria: Secondary | ICD-10-CM | POA: Insufficient documentation

## 2023-02-02 DIAGNOSIS — Z3482 Encounter for supervision of other normal pregnancy, second trimester: Secondary | ICD-10-CM

## 2023-02-02 DIAGNOSIS — O99891 Other specified diseases and conditions complicating pregnancy: Secondary | ICD-10-CM | POA: Insufficient documentation

## 2023-02-02 DIAGNOSIS — Z3480 Encounter for supervision of other normal pregnancy, unspecified trimester: Secondary | ICD-10-CM

## 2023-02-02 DIAGNOSIS — Z3A16 16 weeks gestation of pregnancy: Secondary | ICD-10-CM

## 2023-02-02 MED ORDER — NITROFURANTOIN MONOHYD MACRO 100 MG PO CAPS: 100.0000 mg | ORAL_CAPSULE | Freq: Two times a day (BID) | ORAL | 0 refills | Status: DC

## 2023-02-02 NOTE — Progress Notes (Signed)
LOW-RISK PREGNANCY VISIT Patient name: Michelle Larson MRN [id number]  Date of birth: Jun 28, 1995 Chief Complaint:   Routine Prenatal Visit (2nd IT. Did't take keflex due insurance)  History of Present Illness:   Michelle Larson is a 28 y.o. [id number] female at [redacted]w[redacted]d with an Estimated Date of Delivery: 07/15/23 being seen today for ongoing management of a low-risk pregnancy.   Today she reports no complaints. Didn't take keflex for ASB, doesn't like taking meds. Didn't realize it was for UTI.  Contractions: Not present.  .  Movement: Absent. denies leaking of fluid.     01/05/2023    2:57 PM 10/28/2020    9:15 AM 12/07/2018    2:50 PM  Depression screen PHQ 2/9  Decreased Interest 0 0 0  Down, Depressed, Hopeless 0 0 0  PHQ - 2 Score 0 0 0  Altered sleeping 1    Tired, decreased energy 1    Change in appetite 0    Feeling bad or failure about yourself  0    Trouble concentrating 0    Moving slowly or fidgety/restless 0    Suicidal thoughts 0    PHQ-9 Score 2          01/05/2023    2:57 PM  GAD 7 : Generalized Anxiety Score  Nervous, Anxious, on Edge 0  Control/stop worrying 0  Worry too much - different things 0  Trouble relaxing 0  Restless 0  Easily annoyed or irritable 1  Afraid - awful might happen 0  Total GAD 7 Score 1      Review of Systems:   Pertinent items are noted in HPI Denies abnormal vaginal discharge w/ itching/odor/irritation, headaches, visual changes, shortness of breath, chest pain, abdominal pain, severe nausea/vomiting, or problems with urination or bowel movements unless otherwise stated above. Pertinent History Reviewed:  Reviewed past medical,surgical, social, obstetrical and family history.  Reviewed problem list, medications and allergies. Physical Assessment:   Vitals:   02/02/23 1000  BP: 101/60  Pulse: 84  Weight: 203 lb (92.1 kg)  Body mass index is 37.13 kg/m.        Physical Examination:   General appearance: Well  appearing, and in no distress  Mental status: Alert, oriented to person, place, and time  Skin: Warm & dry  Cardiovascular: Normal heart rate noted  Respiratory: Normal respiratory effort, no distress  Abdomen: Soft, gravid, nontender  Pelvic: Cervical exam deferred         Extremities: Edema: None  Fetal Status: Fetal Heart Rate (bpm): 144   Movement: Absent    Chaperone: N/A   No results found for this or any previous visit (from the past 24 hours).  Assessment & Plan:  1) Low-risk pregnancy [id number] at [redacted]w[redacted]d with an Estimated Date of Delivery: 07/15/23   2) Recent ASB, didn't take keflex b/c didn't realize it was for UTI, doesn't like taking meds. Discussed importance. Rx macrobid   Meds:  Meds ordered this encounter  Medications   nitrofurantoin, macrocrystal-monohydrate, (MACROBID) 100 MG capsule    Sig: Take 1 capsule (100 mg total) by mouth 2 (two) times daily. X 7 days    Dispense:  14 capsule    Refill:  0   Labs/procedures today: 2nd IT  Plan:  Continue routine obstetrical care  Next visit: prefers will be in person for u/s     Reviewed: Preterm labor symptoms and general obstetric precautions including but not limited to vaginal bleeding, contractions, leaking of fluid and  fetal movement were reviewed in detail with the patient.  All questions were answered. Does have home bp cuff. Office bp cuff given: not applicable. Check bp weekly, let us know if consistently >140 and/or >90.  Follow-up: Return for As scheduled.  Future Appointments  Date Time Provider Department Center  03/02/2023  3:00 PM [facility] [place] [facility] None  03/02/2023  3:50 PM Cheral Marker, CNM [facility]    Orders Placed This Encounter  Procedures   US OB Comp + 14 Wk   INTEGRATED 2   Cheral Marker CNM, [facility] 02/02/2023 10:23 AM

## 2023-02-02 NOTE — Patient Instructions (Signed)
Michelle Larson, thank you for choosing our office today! We appreciate the opportunity to meet your healthcare needs. You may receive a short survey by mail, e-mail, or through [employer]. If you are happy with your care we would appreciate if you could take just a few minutes to complete the survey questions. We read all of your comments and take your feedback very seriously. Thank you again for choosing our office.  Center for [employer] Team at [facility] ([address] [place], [place] [address]) Entrance C, located off of [address] Free 24/7 valet parking  Go to [email or web address] to register for FREE online childbirth classes  Call the office [phone]) or go to [facility] if: You begin to severe cramping Your water breaks.  Sometimes it is a big gush of fluid, sometimes it is just a trickle that keeps getting your panties wet or running down your legs You have vaginal bleeding.  It is normal to have a small amount of spotting if your cervix was checked.   [facility] Pediatricians/Family Doctors [facility]): [address] Colette Ribas, [phone]           [facility]: [address], [phone]                [facility]): [address], [phone] (call to ask if accepting patients) [facility]: [address], [place], [phone]    [facility] Pediatricians/Family Doctors [facility]): [phone], [phone] [facility]: [address] [place], [phone] [facility]: [address], [phone]  [facility] Doctors  [facility]): [phone] [facility]: [address] [phone]   [facility] Doctors [facility]: [address] [phone]  [facility] Doctors  [facility]: [phone], [phone]  Home Blood Pressure Monitoring for Patients   Your provider has recommended that you check your blood pressure (BP) at least once a week at home. If you do not have a blood pressure cuff at home, one will be provided for you. Contact your provider if you have not received your monitor within 1 week.   Helpful Tips for Accurate Home Blood Pressure Checks  Don't smoke, exercise, or drink caffeine 30 minutes before checking your BP Use the restroom before checking your BP (a full bladder can raise your pressure) Relax in a comfortable upright chair Feet on the ground Left arm resting comfortably on a flat surface at the level of your heart Legs uncrossed Back supported Sit quietly and don't talk Place the cuff on your bare arm Adjust snuggly, so that only two fingertips can fit between your skin and the top of the cuff Check 2 readings separated by at least one minute Keep a log of your BP readings For a visual, please reference this diagram: http://ccnc.care/bpdiagram  Provider Name: [facility]     Phone: [phone]  Zone 1: ALL CLEAR  Continue to monitor your symptoms:  BP reading is less than 140 (top number) or less than 90 (bottom number)  No right upper stomach pain No headaches or seeing spots No feeling nauseated or throwing up No swelling in face and hands  Zone 2: CAUTION Call your doctor's office for any of the following:  BP reading is greater than 140 (top number) or greater than  90 (bottom number)  Stomach pain under your ribs in the middle or right side Headaches or seeing spots Feeling nauseated or throwing up Swelling in face and hands  Zone 3: EMERGENCY  Seek immediate medical care if you have any of the following:  BP reading is greater than160 (top number) or greater than 110 (bottom number) Severe headaches not improving with Tylenol Serious difficulty catching your breath Any worsening symptoms from  Zone 2     Second Trimester of Pregnancy The second trimester is from week 14 through week 27 (months 4 through 6). The second trimester is often a time when you feel your best. Your body has adjusted to being pregnant, and you begin to feel better physically. Usually, morning sickness has lessened or quit completely, you may have more energy, and you may have an increase in appetite. The second trimester is also a time when the fetus is growing rapidly. At the end of the sixth month, the fetus is about 9 inches long and weighs about 1 pounds. You will likely begin to feel the baby move (quickening) between 16 and 20 weeks of pregnancy. Body changes during your second trimester Your body continues to go through many changes during your second trimester. The changes vary from woman to woman. Your weight will continue to increase. You will notice your lower abdomen bulging out. You may begin to get stretch marks on your hips, abdomen, and breasts. You may develop headaches that can be relieved by medicines. The medicines should be approved by your health care provider. You may urinate more often because the fetus is pressing on your bladder. You may develop or continue to have heartburn as a result of your pregnancy. You may develop constipation because certain hormones are causing the muscles that push waste through your intestines to slow down. You may develop hemorrhoids or swollen, bulging veins (varicose veins). You may have back pain. This is caused by: Weight gain. Pregnancy hormones that are relaxing the joints in your pelvis. A shift in weight and the muscles that support your balance. Your breasts will continue to grow and they will continue to become tender. Your gums may bleed and may be sensitive to brushing and flossing. Dark spots or blotches (chloasma, mask of pregnancy) may develop on your face. This will likely fade after the baby is born. A dark line from your belly button to  the pubic area (linea nigra) may appear. This will likely fade after the baby is born. You may have changes in your hair. These can include thickening of your hair, rapid growth, and changes in texture. Some women also have hair loss during or after pregnancy, or hair that feels dry or thin. Your hair will most likely return to normal after your baby is born.  What to expect at prenatal visits During a routine prenatal visit: You will be weighed to make sure you and the fetus are growing normally. Your blood pressure will be taken. Your abdomen will be measured to track your baby's growth. The fetal heartbeat will be listened to. Any test results from the previous visit will be discussed.  Your health care provider may ask you: How you are feeling. If you are feeling the baby move. If you have had any abnormal symptoms, such as leaking fluid, bleeding, severe headaches, or abdominal cramping. If you are using any tobacco products, including cigarettes, chewing tobacco, and electronic cigarettes. If you have any questions.  Other tests that may be performed during  your second trimester include: Blood tests that check for: Low iron levels (anemia). High blood sugar that affects pregnant women (gestational diabetes) between 83 and 28 weeks. Rh antibodies. This is to check for a protein on red blood cells (Rh factor). Urine tests to check for infections, diabetes, or protein in the urine. An ultrasound to confirm the proper growth and development of the baby. An amniocentesis to check for possible genetic problems. Fetal screens for spina bifida and Down syndrome. HIV (human immunodeficiency virus) testing. Routine prenatal testing includes screening for HIV, unless you choose not to have this test.  Follow these instructions at home: Medicines Follow your health care provider's instructions regarding medicine use. Specific medicines may be either safe or unsafe to take during  pregnancy. Take a prenatal vitamin that contains at least 600 micrograms (mcg) of folic acid. If you develop constipation, try taking a stool softener if your health care provider approves. Eating and drinking Eat a balanced diet that includes fresh fruits and vegetables, whole grains, good sources of protein such as meat, eggs, or tofu, and low-fat dairy. Your health care provider will help you determine the amount of weight gain that is right for you. Avoid raw meat and uncooked cheese. These carry germs that can cause birth defects in the baby. If you have low calcium intake from food, talk to your health care provider about whether you should take a daily calcium supplement. Limit foods that are high in fat and processed sugars, such as fried and sweet foods. To prevent constipation: Drink enough fluid to keep your urine clear or pale yellow. Eat foods that are high in fiber, such as fresh fruits and vegetables, whole grains, and beans. Activity Exercise only as directed by your health care provider. Most women can continue their usual exercise routine during pregnancy. Try to exercise for 30 minutes at least 5 days a week. Stop exercising if you experience uterine contractions. Avoid heavy lifting, wear low heel shoes, and practice good posture. A sexual relationship may be continued unless your health care provider directs you otherwise. Relieving pain and discomfort Wear a good support bra to prevent discomfort from breast tenderness. Take warm sitz baths to soothe any pain or discomfort caused by hemorrhoids. Use hemorrhoid cream if your health care provider approves. Rest with your legs elevated if you have leg cramps or low back pain. If you develop varicose veins, wear support hose. Elevate your feet for 15 minutes, 3-4 times a day. Limit salt in your diet. Prenatal Care Write down your questions. Take them to your prenatal visits. Keep all your prenatal visits as told by your health  care provider. This is important. Safety Wear your seat belt at all times when driving. Make a list of emergency phone numbers, including numbers for family, friends, the hospital, and police and fire departments. General instructions Ask your health care provider for a referral to a local prenatal education class. Begin classes no later than the beginning of month 6 of your pregnancy. Ask for help if you have counseling or nutritional needs during pregnancy. Your health care provider can offer advice or refer you to specialists for help with various needs. Do not use hot tubs, steam rooms, or saunas. Do not douche or use tampons or scented sanitary pads. Do not cross your legs for long periods of time. Avoid cat litter boxes and soil used by cats. These carry germs that can cause birth defects in the baby and possibly loss of the  fetus by miscarriage or stillbirth. Avoid all smoking, herbs, alcohol, and unprescribed drugs. Chemicals in these products can affect the formation and growth of the baby. Do not use any products that contain nicotine or tobacco, such as cigarettes and e-cigarettes. If you need help quitting, ask your health care provider. Visit your dentist if you have not gone yet during your pregnancy. Use a soft toothbrush to brush your teeth and be gentle when you floss. Contact a health care provider if: You have dizziness. You have mild pelvic cramps, pelvic pressure, or nagging pain in the abdominal area. You have persistent nausea, vomiting, or diarrhea. You have a bad smelling vaginal discharge. You have pain when you urinate. Get help right away if: You have a fever. You are leaking fluid from your vagina. You have spotting or bleeding from your vagina. You have severe abdominal cramping or pain. You have rapid weight gain or weight loss. You have shortness of breath with chest pain. You notice sudden or extreme swelling of your face, hands, ankles, feet, or legs. You  have not felt your baby move in over an hour. You have severe headaches that do not go away when you take medicine. You have vision changes. Summary The second trimester is from week 14 through week 27 (months 4 through 6). It is also a time when the fetus is growing rapidly. Your body goes through many changes during pregnancy. The changes vary from woman to woman. Avoid all smoking, herbs, alcohol, and unprescribed drugs. These chemicals affect the formation and growth your baby. Do not use any tobacco products, such as cigarettes, chewing tobacco, and e-cigarettes. If you need help quitting, ask your health care provider. Contact your health care provider if you have any questions. Keep all prenatal visits as told by your health care provider. This is important. This information is not intended to replace advice given to you by your health care provider. Make sure you discuss any questions you have with your health care provider. Document Released: 12/16/2000 Document Revised: 05/30/2015 Document Reviewed: 02/23/2012 Elsevier Interactive Patient Education  2017 [employer].

## 2023-02-04 ENCOUNTER — Encounter: Payer: Self-pay | Admitting: Women's Health

## 2023-02-04 LAB — INTEGRATED 2
AFP MoM: 1.01
Alpha-Fetoprotein: 27 ng/mL
Crown Rump Length: 64.1 mm
DIA MoM: 0.74
DIA Value: 100 pg/mL
Estriol, Unconjugated: 0.72 ng/mL
Gest. Age on Collection Date: 12.6 wk
Gestational Age: 16.6 wk
Maternal Age at EDD: 29.5 a
Nuchal Translucency (NT): 1.7 mm
Nuchal Translucency MoM: 0.95
Number of Fetuses: 1
PAPP-A MoM: 0.61
PAPP-A Value: 433.9 ng/mL
Sonographer ID#: 309760
Test Results:: NEGATIVE
Weight: 203 [lb_av]
Weight: 203 [lb_av]
hCG MoM: 0.61
hCG Value: 15.9 [IU]/mL
uE3 MoM: 0.83

## 2023-02-17 ENCOUNTER — Other Ambulatory Visit: Payer: Self-pay

## 2023-02-17 ENCOUNTER — Inpatient Hospital Stay (HOSPITAL_COMMUNITY)
Admission: AD | Admit: 2023-02-17 | Discharge: 2023-02-17 | Disposition: A | Discharge status: Home / Self Care | Payer: Self-pay | Attending: Obstetrics & Gynecology | Admitting: Obstetrics & Gynecology

## 2023-02-17 DIAGNOSIS — Z3A19 19 weeks gestation of pregnancy: Secondary | ICD-10-CM | POA: Insufficient documentation

## 2023-02-17 DIAGNOSIS — Z3A18 18 weeks gestation of pregnancy: Secondary | ICD-10-CM

## 2023-02-17 DIAGNOSIS — O98512 Other viral diseases complicating pregnancy, second trimester: Secondary | ICD-10-CM | POA: Diagnosis not present

## 2023-02-17 DIAGNOSIS — U071 COVID-19: Secondary | ICD-10-CM

## 2023-02-17 DIAGNOSIS — R519 Headache, unspecified: Secondary | ICD-10-CM | POA: Diagnosis present

## 2023-02-17 LAB — RESP PANEL BY RT-PCR (RSV, FLU A&B, COVID)  RVPGX2
Influenza A by PCR: NEGATIVE
Influenza B by PCR: NEGATIVE
Resp Syncytial Virus by PCR: NEGATIVE
SARS Coronavirus 2 by RT PCR: POSITIVE — AB

## 2023-02-17 LAB — URINALYSIS, ROUTINE W REFLEX MICROSCOPIC
Bilirubin Urine: NEGATIVE
Glucose, UA: NEGATIVE mg/dL
Hgb urine dipstick: NEGATIVE
Ketones, ur: 80 mg/dL — AB
Nitrite: NEGATIVE
Protein, ur: 30 mg/dL — AB
Specific Gravity, Urine: 1.021 (ref 1.005–1.030)
Squamous Epithelial / HPF: >50 /[HPF] (ref 0–5)
WBC, UA: >50 WBC/hpf (ref 0–5)
pH: 5 (ref 5.0–8.0)

## 2023-02-17 MED ORDER — FLUTICASONE PROPIONATE 50 MCG/ACT NA SUSP
2.0000 | Freq: Once | NASAL | Status: AC
  Administered 2023-02-17: 2 via NASAL
  Filled 2023-02-17: qty 16

## 2023-02-17 MED ORDER — ONDANSETRON 4 MG PO TBDP: 4.0000 mg | ORAL_TABLET | Freq: Four times a day (QID) | ORAL | 0 refills | Status: DC | PRN

## 2023-02-17 MED ORDER — PROMETHAZINE HCL 25 MG PO TABS: 25.0000 mg | ORAL_TABLET | Freq: Four times a day (QID) | ORAL | 2 refills | Status: DC | PRN

## 2023-02-17 MED ORDER — GUAIFENESIN ER 600 MG PO TB12
600.0000 mg | ORAL_TABLET | Freq: Once | ORAL | Status: AC
Start: 2023-02-17 — End: 2023-02-17
  Administered 2023-02-17: 600 mg via ORAL
  Filled 2023-02-17: qty 1

## 2023-02-17 MED ORDER — ACETAMINOPHEN 500 MG PO TABS
1000.0000 mg | ORAL_TABLET | Freq: Once | ORAL | Status: AC
  Administered 2023-02-17: 1000 mg via ORAL
  Filled 2023-02-17: qty 2

## 2023-02-17 MED ORDER — GUAIFENESIN ER 600 MG PO TB12: 600.0000 mg | ORAL_TABLET | Freq: Two times a day (BID) | ORAL | 1 refills | Status: DC

## 2023-02-17 NOTE — MAU Provider Note (Addendum)
Chief Complaint:  Generalized Body Aches, Headache, Nasal Congestion, Sore Throat, and Flank Pain (/)   Event Date/Time   First Provider Initiated Contact with Patient 02/17/23 2252      HPI: Michelle Larson is a 28 y.o. [id number] at 84w6dwho presents to maternity admissions reporting body aches, chills, headache and congestion.  Tylenol did not help.  Two of her kids have been sick this week. . She denies LOF, vaginal bleeding, urinary symptoms.  Headache  This is a new problem. The current episode started today. The quality of the pain is described as aching. Associated symptoms include coughing, drainage, a fever and rhinorrhea. She has tried acetaminophen for the symptoms. The treatment provided no relief.  Sore Throat  The pain is moderate. Associated symptoms include congestion and coughing. She has tried acetaminophen for the symptoms. The treatment provided no relief.   RN Note: Michelle Larson is a 28 y.o. at [redacted]w[redacted]d here in MAU reporting: Sunday night had left flank pain, which has continued. Yesterday started having sore throat. This morning started having HA, body aches, congestion, chills, and unable to eat. Took extra strength tylenol around 0930 and 1330 - did not help.   Onset of complaint: yesterday   Past Medical History: Past Medical History:  Diagnosis Date   Medical history non-contributory     Past obstetric history: OB History  Gravida Para Term Preterm AB Living  5 3 3  1 3   SAB IAB Ectopic Multiple Live Births  1   0 3    # Outcome Date GA Lbr Len/2nd Weight Sex Type Anes PTL Lv  5 Current           4 Term 05/31/19 [redacted]w[redacted]d 09:31 / 00:01 4281 g M Vag-Spont None  LIV  3 SAB 06/2018          2 Term 02/18/16 [redacted]w[redacted]d  3827 g M Vag-Spont  N LIV  1 Term 08/25/12 [redacted]w[redacted]d  3969 g F Vag-Spont  N LIV    Past Surgical History: Past Surgical History:  Procedure Laterality Date   CHOLECYSTECTOMY      Family History: Family History  Problem Relation Age of Onset    Hyperlipidemia Maternal Grandmother    Diabetes Maternal Grandfather    Diabetes Mother     Social History: Social History   Tobacco Use   Smoking status: Never   Smokeless tobacco: Never  Vaping Use   Vaping status: Never Used  Substance Use Topics   Alcohol use: Not Currently   Drug use: No    Allergies: No Known Allergies  Meds:  Medications Prior to Admission  Medication Sig Dispense Refill Last Dose/Taking   cephALEXin (KEFLEX) 500 MG capsule Take 1 capsule (500 mg total) by mouth 3 (three) times daily. (Patient not taking: Reported on 02/02/2023) 21 capsule 0    Doxylamine-Pyridoxine (DICLEGIS) 10-10 MG TBEC 2 tabs q hs, if sx persist add 1 tab q am on day 3, if sx persist add 1 tab q afternoon on day 4 (Patient not taking: Reported on 02/02/2023) 100 tablet 6    fluticasone (FLONASE) 50 MCG/ACT nasal spray Place 1 spray into both nostrils 2 (two) times daily. (Patient not taking: Reported on 11/16/2022) 16 g 2    nitrofurantoin, macrocrystal-monohydrate, (MACROBID) 100 MG capsule Take 1 capsule (100 mg total) by mouth 2 (two) times daily. X 7 days 14 capsule 0    Prenatal Vit-Fe Fumarate-FA (PRENATAL VITAMIN PO) Take by mouth.       I  have reviewed patient's Past Medical Hx, Surgical Hx, Family Hx, Social Hx, medications and allergies.   ROS:  Review of Systems  Constitutional:  Positive for chills, fatigue and fever.  HENT:  Positive for congestion and rhinorrhea.   Respiratory:  Positive for cough.    Other systems negative  Physical Exam  Patient Vitals for the past 24 hrs:  BP Temp Temp src Pulse Resp SpO2 Height Weight  02/17/23 2040 110/67 100.1 F (37.8 C) Oral (!) 105 19 99 % 5\' 2"  (1.575 m) 92 kg   Constitutional: Well-developed, well-nourished female in no acute distress.  Cardiovascular: normal rate and rhythm Respiratory: normal effort, clear to auscultation bilaterally GI: Abd soft, non-tender, gravid appropriate for gestational age.   No rebound  or guarding. MS: Extremities nontender, no edema, normal ROM Neurologic: Alert and oriented x 4.  . FHT:  170   Labs: Results for orders placed or performed during the hospital encounter of 02/17/23 (from the past 24 hours)  Urinalysis, Routine w reflex microscopic -Urine, Clean Catch     Status: Abnormal   Collection Time: 02/17/23  9:06 PM  Result Value Ref Range   Color, Urine AMBER (A) YELLOW   APPearance CLOUDY (A) CLEAR   Specific Gravity, Urine 1.021 1.005 - 1.030   pH 5.0 5.0 - 8.0   Glucose, UA NEGATIVE NEGATIVE mg/dL   Hgb urine dipstick NEGATIVE NEGATIVE   Bilirubin Urine NEGATIVE NEGATIVE   Ketones, ur 80 (A) NEGATIVE mg/dL   Protein, ur 30 (A) NEGATIVE mg/dL   Nitrite NEGATIVE NEGATIVE   Leukocytes,Ua MODERATE (A) NEGATIVE   RBC / HPF 0-5 0 - 5 RBC/hpf   WBC, UA >50 0 - 5 WBC/hpf   Bacteria, UA MANY (A) NONE SEEN   Squamous Epithelial / HPF >50 0 - 5 /HPF   Mucus PRESENT   Resp panel by RT-PCR (RSV, Flu A&B, Covid) Anterior Nasal Swab     Status: Abnormal   Collection Time: 02/17/23  9:36 PM   Specimen: Anterior Nasal Swab  Result Value Ref Range   SARS Coronavirus 2 by RT PCR POSITIVE (A) NEGATIVE   Influenza A by PCR NEGATIVE NEGATIVE   Influenza B by PCR NEGATIVE NEGATIVE   Resp Syncytial Virus by PCR NEGATIVE NEGATIVE    O/Positive/-- (12/31 1601)  Imaging:  No results found.  MAU Course/MDM: I have reviewed the triage vital signs and the nursing notes.   Pertinent labs & imaging results that were available during my care of the patient were reviewed by me and considered in my medical decision making (see chart for details).      I have reviewed her medical records including past results, notes and treatments.   I have ordered labs and reviewed results.  .  Treatments in MAU included Flonase, Tylenol and Mucinex DIscussed covid URI and recommended conservative treatment. .  Reviewed max dose for Tylenol   Assessment: Single IUP at [redacted]w[redacted]d Covid  Infection  Plan: Discharge home Rx Flonase and Mucinex for congestion Recommend Tylenol for aches and fever Rx Zofran and Phenergan for nausea Follow up in Office for prenatal visits and recheck Encouraged to return if she develops worsening of symptoms, increase in pain, fever, or other concerning symptoms.   Pt stable at time of discharge.  Wynelle Bourgeois CNM, MSN Certified Nurse-Midwife 02/17/2023 10:52 PM

## 2023-02-17 NOTE — MAU Note (Signed)
.  Michelle Larson is a 28 y.o. at [redacted]w[redacted]d here in MAU reporting: Sunday night had left flank pain, which has continued. Yesterday started having sore throat. This morning started having HA, body aches, congestion, chills, and unable to eat. Took extra strength tylenol around 0930 and 1330 - did not help.   Onset of complaint: yesterday  Pain score: 10 - HA; 8 - body aches; 7 - flank  Vitals:   02/17/23 2040  BP: 110/67  Pulse: (!) 105  Resp: 19  Temp: 100.1 F (37.8 C)  SpO2: 99%     FHT: 170  Lab orders placed from triage: UA

## 2023-02-18 LAB — CULTURE, OB URINE

## 2023-02-19 ENCOUNTER — Encounter: Payer: Self-pay | Admitting: Advanced Practice Midwife

## 2023-02-19 DIAGNOSIS — B951 Streptococcus, group B, as the cause of diseases classified elsewhere: Secondary | ICD-10-CM | POA: Insufficient documentation

## 2023-03-02 ENCOUNTER — Ambulatory Visit: Payer: Self-pay | Admitting: Women's Health

## 2023-03-02 ENCOUNTER — Other Ambulatory Visit: Payer: Self-pay | Admitting: Women's Health

## 2023-03-02 ENCOUNTER — Encounter: Payer: Self-pay | Admitting: Women's Health

## 2023-03-02 ENCOUNTER — Ambulatory Visit: Payer: Self-pay

## 2023-03-02 VITALS — BP 109/63 | HR 80 | Wt 200.0 lb

## 2023-03-02 DIAGNOSIS — O468X2 Other antepartum hemorrhage, second trimester: Secondary | ICD-10-CM

## 2023-03-02 DIAGNOSIS — Z363 Encounter for antenatal screening for malformations: Secondary | ICD-10-CM

## 2023-03-02 DIAGNOSIS — Z3482 Encounter for supervision of other normal pregnancy, second trimester: Secondary | ICD-10-CM

## 2023-03-02 DIAGNOSIS — R8271 Bacteriuria: Secondary | ICD-10-CM

## 2023-03-02 DIAGNOSIS — Z3A20 20 weeks gestation of pregnancy: Secondary | ICD-10-CM

## 2023-03-02 DIAGNOSIS — O99891 Other specified diseases and conditions complicating pregnancy: Secondary | ICD-10-CM

## 2023-03-02 NOTE — Patient Instructions (Signed)
 Michelle Larson, thank you for choosing our office today! We appreciate the opportunity to meet your healthcare needs. You may receive a short survey by mail, e-mail, or through [employer]. If you are happy with your care we would appreciate if you could take just a few minutes to complete the survey questions. We read all of your comments and take your feedback very seriously. Thank you again for choosing our office.  Center for [employer] Team at [facility] ([address] [place], [place] [address]) Entrance C, located off of [address] Free 24/7 valet parking  Go to [email or web address] to register for FREE online childbirth classes  Call the office [phone]) or go to [facility] if: You begin to severe cramping Your water breaks.  Sometimes it is a big gush of fluid, sometimes it is just a trickle that keeps getting your panties wet or running down your legs You have vaginal bleeding.  It is normal to have a small amount of spotting if your cervix was checked.   [facility] Pediatricians/Family Doctors [facility]): [address] Colette Ribas, [phone]           [facility]: [address], [phone]                [facility]): [address], [phone] (call to ask if accepting patients) [facility]: [address], [place], [phone]    [facility] Pediatricians/Family Doctors [facility]): [phone], [phone] [facility]: [address] [place], [phone] [facility]: [address], [phone]  [facility] Doctors  [facility]): [phone] [facility]: [address] [phone]   [facility] Doctors [facility]: [address] [phone]  [facility] Doctors  [facility]: [phone], [phone]  Home Blood Pressure Monitoring for Patients   Your provider has recommended that you check your blood pressure (BP) at least once a week at home. If you do not have a blood pressure cuff at home, one will be provided for you. Contact your provider if you have not received your monitor within 1 week.   Helpful Tips for Accurate Home Blood Pressure Checks  Don't smoke, exercise, or drink caffeine 30 minutes before checking your BP Use the restroom before checking your BP (a full bladder can raise your pressure) Relax in a comfortable upright chair Feet on the ground Left arm resting comfortably on a flat surface at the level of your heart Legs uncrossed Back supported Sit quietly and don't talk Place the cuff on your bare arm Adjust snuggly, so that only two fingertips can fit between your skin and the top of the cuff Check 2 readings separated by at least one minute Keep a log of your BP readings For a visual, please reference this diagram: http://ccnc.care/bpdiagram  Provider Name: [facility]     Phone: [phone]  Zone 1: ALL CLEAR  Continue to monitor your symptoms:  BP reading is less than 140 (top number) or less than 90 (bottom number)  No right upper stomach pain No headaches or seeing spots No feeling nauseated or throwing up No swelling in face and hands  Zone 2: CAUTION Call your doctor's office for any of the following:  BP reading is greater than 140 (top number) or greater than  90 (bottom number)  Stomach pain under your ribs in the middle or right side Headaches or seeing spots Feeling nauseated or throwing up Swelling in face and hands  Zone 3: EMERGENCY  Seek immediate medical care if you have any of the following:  BP reading is greater than160 (top number) or greater than 110 (bottom number) Severe headaches not improving with Tylenol Serious difficulty catching your breath Any worsening symptoms from  Zone 2     Second Trimester of Pregnancy The second trimester is from week 14 through week 27 (months 4 through 6). The second trimester is often a time when you feel your best. Your body has adjusted to being pregnant, and you begin to feel better physically. Usually, morning sickness has lessened or quit completely, you may have more energy, and you may have an increase in appetite. The second trimester is also a time when the fetus is growing rapidly. At the end of the sixth month, the fetus is about 9 inches long and weighs about 1 pounds. You will likely begin to feel the baby move (quickening) between 16 and 20 weeks of pregnancy. Body changes during your second trimester Your body continues to go through many changes during your second trimester. The changes vary from woman to woman. Your weight will continue to increase. You will notice your lower abdomen bulging out. You may begin to get stretch marks on your hips, abdomen, and breasts. You may develop headaches that can be relieved by medicines. The medicines should be approved by your health care provider. You may urinate more often because the fetus is pressing on your bladder. You may develop or continue to have heartburn as a result of your pregnancy. You may develop constipation because certain hormones are causing the muscles that push waste through your intestines to slow down. You may develop hemorrhoids or swollen, bulging veins (varicose veins). You may have back pain. This is caused by: Weight gain. Pregnancy hormones that are relaxing the joints in your pelvis. A shift in weight and the muscles that support your balance. Your breasts will continue to grow and they will continue to become tender. Your gums may bleed and may be sensitive to brushing and flossing. Dark spots or blotches (chloasma, mask of pregnancy) may develop on your face. This will likely fade after the baby is born. A dark line from your belly button to  the pubic area (linea nigra) may appear. This will likely fade after the baby is born. You may have changes in your hair. These can include thickening of your hair, rapid growth, and changes in texture. Some women also have hair loss during or after pregnancy, or hair that feels dry or thin. Your hair will most likely return to normal after your baby is born.  What to expect at prenatal visits During a routine prenatal visit: You will be weighed to make sure you and the fetus are growing normally. Your blood pressure will be taken. Your abdomen will be measured to track your baby's growth. The fetal heartbeat will be listened to. Any test results from the previous visit will be discussed.  Your health care provider may ask you: How you are feeling. If you are feeling the baby move. If you have had any abnormal symptoms, such as leaking fluid, bleeding, severe headaches, or abdominal cramping. If you are using any tobacco products, including cigarettes, chewing tobacco, and electronic cigarettes. If you have any questions.  Other tests that may be performed during  your second trimester include: Blood tests that check for: Low iron levels (anemia). High blood sugar that affects pregnant women (gestational diabetes) between 83 and 28 weeks. Rh antibodies. This is to check for a protein on red blood cells (Rh factor). Urine tests to check for infections, diabetes, or protein in the urine. An ultrasound to confirm the proper growth and development of the baby. An amniocentesis to check for possible genetic problems. Fetal screens for spina bifida and Down syndrome. HIV (human immunodeficiency virus) testing. Routine prenatal testing includes screening for HIV, unless you choose not to have this test.  Follow these instructions at home: Medicines Follow your health care provider's instructions regarding medicine use. Specific medicines may be either safe or unsafe to take during  pregnancy. Take a prenatal vitamin that contains at least 600 micrograms (mcg) of folic acid. If you develop constipation, try taking a stool softener if your health care provider approves. Eating and drinking Eat a balanced diet that includes fresh fruits and vegetables, whole grains, good sources of protein such as meat, eggs, or tofu, and low-fat dairy. Your health care provider will help you determine the amount of weight gain that is right for you. Avoid raw meat and uncooked cheese. These carry germs that can cause birth defects in the baby. If you have low calcium intake from food, talk to your health care provider about whether you should take a daily calcium supplement. Limit foods that are high in fat and processed sugars, such as fried and sweet foods. To prevent constipation: Drink enough fluid to keep your urine clear or pale yellow. Eat foods that are high in fiber, such as fresh fruits and vegetables, whole grains, and beans. Activity Exercise only as directed by your health care provider. Most women can continue their usual exercise routine during pregnancy. Try to exercise for 30 minutes at least 5 days a week. Stop exercising if you experience uterine contractions. Avoid heavy lifting, wear low heel shoes, and practice good posture. A sexual relationship may be continued unless your health care provider directs you otherwise. Relieving pain and discomfort Wear a good support bra to prevent discomfort from breast tenderness. Take warm sitz baths to soothe any pain or discomfort caused by hemorrhoids. Use hemorrhoid cream if your health care provider approves. Rest with your legs elevated if you have leg cramps or low back pain. If you develop varicose veins, wear support hose. Elevate your feet for 15 minutes, 3-4 times a day. Limit salt in your diet. Prenatal Care Write down your questions. Take them to your prenatal visits. Keep all your prenatal visits as told by your health  care provider. This is important. Safety Wear your seat belt at all times when driving. Make a list of emergency phone numbers, including numbers for family, friends, the hospital, and police and fire departments. General instructions Ask your health care provider for a referral to a local prenatal education class. Begin classes no later than the beginning of month 6 of your pregnancy. Ask for help if you have counseling or nutritional needs during pregnancy. Your health care provider can offer advice or refer you to specialists for help with various needs. Do not use hot tubs, steam rooms, or saunas. Do not douche or use tampons or scented sanitary pads. Do not cross your legs for long periods of time. Avoid cat litter boxes and soil used by cats. These carry germs that can cause birth defects in the baby and possibly loss of the  fetus by miscarriage or stillbirth. Avoid all smoking, herbs, alcohol, and unprescribed drugs. Chemicals in these products can affect the formation and growth of the baby. Do not use any products that contain nicotine or tobacco, such as cigarettes and e-cigarettes. If you need help quitting, ask your health care provider. Visit your dentist if you have not gone yet during your pregnancy. Use a soft toothbrush to brush your teeth and be gentle when you floss. Contact a health care provider if: You have dizziness. You have mild pelvic cramps, pelvic pressure, or nagging pain in the abdominal area. You have persistent nausea, vomiting, or diarrhea. You have a bad smelling vaginal discharge. You have pain when you urinate. Get help right away if: You have a fever. You are leaking fluid from your vagina. You have spotting or bleeding from your vagina. You have severe abdominal cramping or pain. You have rapid weight gain or weight loss. You have shortness of breath with chest pain. You notice sudden or extreme swelling of your face, hands, ankles, feet, or legs. You  have not felt your baby move in over an hour. You have severe headaches that do not go away when you take medicine. You have vision changes. Summary The second trimester is from week 14 through week 27 (months 4 through 6). It is also a time when the fetus is growing rapidly. Your body goes through many changes during pregnancy. The changes vary from woman to woman. Avoid all smoking, herbs, alcohol, and unprescribed drugs. These chemicals affect the formation and growth your baby. Do not use any tobacco products, such as cigarettes, chewing tobacco, and e-cigarettes. If you need help quitting, ask your health care provider. Contact your health care provider if you have any questions. Keep all prenatal visits as told by your health care provider. This is important. This information is not intended to replace advice given to you by your health care provider. Make sure you discuss any questions you have with your health care provider. Document Released: 12/16/2000 Document Revised: 05/30/2015 Document Reviewed: 02/23/2012 Elsevier Interactive Patient Education  2017 [employer].

## 2023-03-02 NOTE — Progress Notes (Signed)
 [place] 20+5 wks,breech,anterior placenta gr 0,retroplacental hemorrhage 12.5 x 1.8 cm,CX 3.9 cm,normal ovaries,SVP of fluid 5 cm,FHR 154 bpm,EFW 363 g 37%,anatomy complete

## 2023-03-02 NOTE — Progress Notes (Signed)
 LOW-RISK PREGNANCY VISIT Patient name: Michelle Larson MRN [id number]  Date of birth: 1995-09-01 Chief Complaint:   Routine Prenatal Visit (Anatomy scan/)  History of Present Illness:   Michelle Larson is a 28 y.o. [phone] female at [redacted]w[redacted]d with an Estimated Date of Delivery: 07/15/23 being seen today for ongoing management of a low-risk pregnancy.   Today she reports  some pressure . No vb.  Contractions: Not present. Vag. Bleeding: None.  Movement: Present. denies leaking of fluid.     01/05/2023    2:57 PM 10/28/2020    9:15 AM 12/07/2018    2:50 PM  Depression screen PHQ 2/9  Decreased Interest 0 0 0  Down, Depressed, Hopeless 0 0 0  PHQ - 2 Score 0 0 0  Altered sleeping 1    Tired, decreased energy 1    Change in appetite 0    Feeling bad or failure about yourself  0    Trouble concentrating 0    Moving slowly or fidgety/restless 0    Suicidal thoughts 0    PHQ-9 Score 2          01/05/2023    2:57 PM  GAD 7 : Generalized Anxiety Score  Nervous, Anxious, on Edge 0  Control/stop worrying 0  Worry too much - different things 0  Trouble relaxing 0  Restless 0  Easily annoyed or irritable 1  Afraid - awful might happen 0  Total GAD 7 Score 1      Review of Systems:   Pertinent items are noted in HPI Denies abnormal vaginal discharge w/ itching/odor/irritation, headaches, visual changes, shortness of breath, chest pain, abdominal pain, severe nausea/vomiting, or problems with urination or bowel movements unless otherwise stated above. Pertinent History Reviewed:  Reviewed past medical,surgical, social, obstetrical and family history.  Reviewed problem list, medications and allergies. Physical Assessment:   Vitals:   03/02/23 1546  BP: 109/63  Pulse: 80  Weight: 200 lb (90.7 kg)  Body mass index is 36.58 kg/m.        Physical Examination:   General appearance: Well appearing, and in no distress  Mental status: Alert, oriented to person, place, and  time  Skin: Warm & dry  Cardiovascular: Normal heart rate noted  Respiratory: Normal respiratory effort, no distress  Abdomen: Soft, gravid, nontender  Pelvic: Cervical exam deferred         Extremities: Edema: None  Fetal Status:     Movement: Present   [place] 20+5 wks,breech,anterior placenta gr 0,retroplacental hemorrhage 12.5 x 1.8 cm,CX 3.9 cm,normal ovaries,SVP of fluid 5 cm,FHR 154 bpm,EFW 363 g 37%,anatomy complete   Chaperone: N/A   No results found for this or any previous visit (from the past 24 hours).  Assessment & Plan:  1) Low-risk pregnancy [id number] at [redacted]w[redacted]d with an Estimated Date of Delivery: 07/15/23   2) Retroplacental hemorrhage, 12.5 x 1.8cm, no bleeding, u/s reviewed by Dr. Despina Hidden, repeat u/s in 4wks  3) PG BMI 36   Meds: No orders of the defined types were placed in this encounter.  Labs/procedures today: U/S  Plan:  Continue routine obstetrical care  Next visit: prefers will be in person for u/s     Reviewed: Preterm labor symptoms and general obstetric precautions including but not limited to vaginal bleeding, contractions, leaking of fluid and fetal movement were reviewed in detail with the patient.  All questions were answered. Does have home bp cuff. Office bp cuff given: no. Check bp weekly, let us know  if consistently >140 and/or >90.  Follow-up: Return in about 4 weeks (around 03/30/2023) for LROB, f/u u/s, MD in person.  Future Appointments  Date Time Provider Department Center  04/01/2023  9:15 AM [facility] [place] [facility] None  04/01/2023 10:10 AM Eure, Amaryllis Dyke, MD [facility]    Orders Placed This Encounter  Procedures   US OB Follow Up   Cheral Marker CNM, [facility] 03/02/2023 4:29 PM

## 2023-03-04 LAB — URINE CULTURE

## 2023-04-01 ENCOUNTER — Ambulatory Visit: Payer: Self-pay | Admitting: Radiology

## 2023-04-01 ENCOUNTER — Encounter: Payer: Self-pay | Admitting: Obstetrics & Gynecology

## 2023-04-01 ENCOUNTER — Ambulatory Visit: Payer: Self-pay | Admitting: Obstetrics & Gynecology

## 2023-04-01 VITALS — BP 95/63 | HR 73 | Wt 202.0 lb

## 2023-04-01 DIAGNOSIS — Z3482 Encounter for supervision of other normal pregnancy, second trimester: Secondary | ICD-10-CM

## 2023-04-01 DIAGNOSIS — Z3A25 25 weeks gestation of pregnancy: Secondary | ICD-10-CM

## 2023-04-01 DIAGNOSIS — O468X2 Other antepartum hemorrhage, second trimester: Secondary | ICD-10-CM

## 2023-04-01 MED ORDER — FLUTICASONE PROPIONATE 50 MCG/ACT NA SUSP: 1.0000 | Freq: Two times a day (BID) | NASAL | 2 refills | Status: DC

## 2023-04-01 NOTE — Progress Notes (Signed)
 HIGH-RISK PREGNANCY VISIT Patient name: Michelle Larson MRN [id number]  Date of birth: 10/27/1995 Chief Complaint:   Routine Prenatal Visit  History of Present Illness:   Michelle Larson is a 28 y.o. [phone] female at [redacted]w[redacted]d with an Estimated Date of Delivery: 07/15/23 being seen today for ongoing management of a high-risk pregnancy complicated by large retroplacental hemorrhage-->resolved.    Today she reports no complaints. Contractions: Not present. Vag. Bleeding: None.  Movement: Present. denies leaking of fluid.      01/05/2023    2:57 PM 10/28/2020    9:15 AM 12/07/2018    2:50 PM  Depression screen PHQ 2/9  Decreased Interest 0 0 0  Down, Depressed, Hopeless 0 0 0  PHQ - 2 Score 0 0 0  Altered sleeping 1    Tired, decreased energy 1    Change in appetite 0    Feeling bad or failure about yourself  0    Trouble concentrating 0    Moving slowly or fidgety/restless 0    Suicidal thoughts 0    PHQ-9 Score 2          01/05/2023    2:57 PM  GAD 7 : Generalized Anxiety Score  Nervous, Anxious, on Edge 0  Control/stop worrying 0  Worry too much - different things 0  Trouble relaxing 0  Restless 0  Easily annoyed or irritable 1  Afraid - awful might happen 0  Total GAD 7 Score 1     Review of Systems:   Pertinent items are noted in HPI Denies abnormal vaginal discharge w/ itching/odor/irritation, headaches, visual changes, shortness of breath, chest pain, abdominal pain, severe nausea/vomiting, or problems with urination or bowel movements unless otherwise stated above. Pertinent History Reviewed:  Reviewed past medical,surgical, social, obstetrical and family history.  Reviewed problem list, medications and allergies. Physical Assessment:   Vitals:   04/01/23 0952  BP: 95/63  Pulse: 73  Weight: 202 lb (91.6 kg)  Body mass index is 36.95 kg/m.           Physical Examination:   General appearance: alert, well appearing, and in no distress  Mental  status: alert, oriented to person, place, and time  Skin: warm & dry   Extremities:      Cardiovascular: normal heart rate noted  Respiratory: normal respiratory effort, no distress  Abdomen: gravid, soft, non-tender  Pelvic: Cervical exam deferred         Fetal Status:     Movement: Present    Fetal Surveillance Testing today: sonogram    Chaperone: N/A    No results found for this or any previous visit (from the past 24 hours).  Assessment & Plan:  High-risk pregnancy: [id number] at [redacted]w[redacted]d with an Estimated Date of Delivery: 07/15/23      ICD-10-CM   1. Encounter for supervision of other normal pregnancy in second trimester  Z34.82     2. Subchorionic hemorrhage of placenta in second trimester: resolved  O46.8X2         Meds:  Meds ordered this encounter  Medications   fluticasone (FLONASE) 50 MCG/ACT nasal spray    Sig: Place 1 spray into both nostrils 2 (two) times daily.    Dispense:  16 g    Refill:  2    Orders: No orders of the defined types were placed in this encounter.    Labs/procedures today: U/S  Treatment Plan:  routine care    Follow-up: Return in about 3 weeks (around  04/22/2023) for LROB, PN2.   No future appointments.  No orders of the defined types were placed in this encounter.  Lazaro Arms  Attending Physician for the Center for [facility] 04/01/2023 10:15 AM

## 2023-04-01 NOTE — Progress Notes (Signed)
 [place]: GA = 25 weeks Single active female fetus,  cephalic, FHR =144 bpm,  anterior pl, gr cental ci, no evidence of retroplacental hemorrhage.  MVP = 5.5 cm,  EFW 42%, 765g  CL = 5 cm, closed, nl ov's

## 2023-04-22 ENCOUNTER — Encounter: Payer: Self-pay | Admitting: Women's Health

## 2023-04-22 ENCOUNTER — Ambulatory Visit: Admitting: Women's Health

## 2023-04-22 ENCOUNTER — Other Ambulatory Visit (HOSPITAL_COMMUNITY)
Admission: RE | Admit: 2023-04-22 | Discharge: 2023-04-22 | Disposition: A | Discharge status: Home / Self Care | Source: Ambulatory Visit | Attending: Women's Health | Admitting: Women's Health

## 2023-04-22 VITALS — BP 103/65 | HR 89 | Wt 204.5 lb

## 2023-04-22 DIAGNOSIS — Z3483 Encounter for supervision of other normal pregnancy, third trimester: Secondary | ICD-10-CM | POA: Diagnosis not present

## 2023-04-22 DIAGNOSIS — Z3A28 28 weeks gestation of pregnancy: Secondary | ICD-10-CM | POA: Insufficient documentation

## 2023-04-22 DIAGNOSIS — Z23 Encounter for immunization: Secondary | ICD-10-CM | POA: Diagnosis not present

## 2023-04-22 DIAGNOSIS — Z3480 Encounter for supervision of other normal pregnancy, unspecified trimester: Secondary | ICD-10-CM

## 2023-04-22 DIAGNOSIS — Z124 Encounter for screening for malignant neoplasm of cervix: Secondary | ICD-10-CM

## 2023-04-22 NOTE — Patient Instructions (Signed)
Michelle Larson, thank you for choosing our office today! We appreciate the opportunity to meet your healthcare needs. You may receive a short survey by mail, e-mail, or through [employer]. If you are happy with your care we would appreciate if you could take just a few minutes to complete the survey questions. We read all of your comments and take your feedback very seriously. Thank you again for choosing our office.  Center for [employer] Team at [place] at [facility] ([place], [place] [address]) Entrance C, located off of [address] parking   CLASSES: Go to [email or web address] to register for classes (childbirth, breastfeeding, waterbirth, infant CPR, daddy bootcamp, etc.)  Call the office [phone]) or go to [facility] if: You begin to have strong, frequent contractions Your water breaks.  Sometimes it is a big gush of fluid, sometimes it is just a trickle that keeps getting your panties wet or running down your legs You have vaginal bleeding.  It is normal to have a small amount of spotting if your cervix was checked.  You don't feel your baby moving like normal.  If you don't, get you something to eat and drink and lay down and focus on feeling your baby move.   If your baby is still not moving like normal, you should call the office or go to [facility]  Call the office [phone]) or go to [facility] hospital for these signs of pre-eclampsia: Severe headache that does not go away with Tylenol Visual changes- seeing spots, double, blurred vision Pain under your right breast or upper abdomen that does not go away with Tums or heartburn medicine Nausea and/or vomiting Severe swelling in your hands, feet, and face   Tdap Vaccine It is recommended that you get the Tdap vaccine during the third trimester of EACH pregnancy to help protect your baby from getting pertussis (whooping cough) 27-36 weeks is the BEST time to do  this so that you can pass the protection on to your baby. During pregnancy is better than after pregnancy, but if you are unable to get it during pregnancy it will be offered at the hospital.  You can get this vaccine with [place], at the health department, your family doctor, or some local pharmacies Everyone who will be around your baby should also be up-to-date on their vaccines before the baby comes. Adults (who are not pregnant) only need 1 dose of Tdap during adulthood.   [facility] Pediatricians/Family Doctors [facility]): [address] [place], [place] [facility]: [address] [place], [phone]                [place] [facility]): [place], [phone] (call to ask if accepting patients) [facility] Department: [address], [place], [place] Pediatricians/Family Doctors [facility]): [place], [address], [place] [facility]: [address] [place], [place] [facility]: [place], [place] [facility]): [phone] [facility]: [address] [place]: [address], [place] [facility]: [phone], [phone]  Home Blood Pressure Monitoring for Patients   Your provider has recommended that you check your  blood pressure (BP) at least once a week at home. If you do not have a blood pressure cuff at home, one will be provided for you. Contact your provider if you have not received your monitor within 1 week.   Helpful Tips for Accurate Home Blood Pressure Checks  Don't smoke, exercise, or drink caffeine 30 minutes before checking your BP Use the restroom before checking your BP (a full bladder can raise your  pressure) Relax in a comfortable upright chair Feet on the ground Left arm resting comfortably on a flat surface at the level of your heart Legs uncrossed Back supported Sit quietly and don't talk Place the cuff on your bare arm Adjust snuggly, so that only two fingertips can fit between your skin and the top of the cuff Check 2 readings separated by at least one minute Keep a log of your BP readings For a visual, please reference this diagram: http://ccnc.care/bpdiagram  Provider Name: [facility]     Phone: [phone]  Zone 1: ALL CLEAR  Continue to monitor your symptoms:  BP reading is less than 140 (top number) or less than 90 (bottom number)  No right upper stomach pain No headaches or seeing spots No feeling nauseated or throwing up No swelling in face and hands  Zone 2: CAUTION Call your doctor's office for any of the following:  BP reading is greater than 140 (top number) or greater than 90 (bottom number)  Stomach pain under your ribs in the middle or right side Headaches or seeing spots Feeling nauseated or throwing up Swelling in face and hands  Zone 3: EMERGENCY  Seek immediate medical care if you have any of the following:  BP reading is greater than160 (top number) or greater than 110 (bottom number) Severe headaches not improving with Tylenol Serious difficulty catching your breath Any worsening symptoms from Zone 2   Third Trimester of Pregnancy The third trimester is from week 29 through week 42, months 7 through 9. The third trimester is a time when the fetus is growing rapidly. At the end of the ninth month, the fetus is about 20 inches in length and weighs 6-10 pounds.  BODY CHANGES Your body goes through many changes during pregnancy. The changes vary from woman to woman.  Your weight will continue to increase. You can expect to gain 25-35 pounds (11-16 kg) by the end of the pregnancy. You may begin to get stretch marks on your hips, abdomen,  and breasts. You may urinate more often because the fetus is moving lower into your pelvis and pressing on your bladder. You may develop or continue to have heartburn as a result of your pregnancy. You may develop constipation because certain hormones are causing the muscles that push waste through your intestines to slow down. You may develop hemorrhoids or swollen, bulging veins (varicose veins). You may have pelvic pain because of the weight gain and pregnancy hormones relaxing your joints between the bones in your pelvis. Backaches may result from overexertion of the muscles supporting your posture. You may have changes in your hair. These can include thickening of your hair, rapid growth, and changes in texture. Some women also have hair loss during or after pregnancy, or hair that feels dry or thin. Your hair will most likely return to normal after your baby is born. Your breasts will continue to grow and be tender. A yellow discharge may leak from your breasts called colostrum. Your belly button may stick out. You may   feel short of breath because of your expanding uterus. You may notice the fetus "dropping," or moving lower in your abdomen. You may have a bloody mucus discharge. This usually occurs a few days to a week before labor begins. Your cervix becomes thin and soft (effaced) near your due date. WHAT TO EXPECT AT YOUR PRENATAL EXAMS  You will have prenatal exams every 2 weeks until week 36. Then, you will have weekly prenatal exams. During a routine prenatal visit: You will be weighed to make sure you and the fetus are growing normally. Your blood pressure is taken. Your abdomen will be measured to track your baby's growth. The fetal heartbeat will be listened to. Any test results from the previous visit will be discussed. You may have a cervical check near your due date to see if you have effaced. At around 36 weeks, your caregiver will check your cervix. At the same time, your  caregiver will also perform a test on the secretions of the vaginal tissue. This test is to determine if a type of bacteria, Group B streptococcus, is present. Your caregiver will explain this further. Your caregiver may ask you: What your birth plan is. How you are feeling. If you are feeling the baby move. If you have had any abnormal symptoms, such as leaking fluid, bleeding, severe headaches, or abdominal cramping. If you have any questions. Other tests or screenings that may be performed during your third trimester include: Blood tests that check for low iron levels (anemia). Fetal testing to check the health, activity level, and growth of the fetus. Testing is done if you have certain medical conditions or if there are problems during the pregnancy. FALSE LABOR You may feel small, irregular contractions that eventually go away. These are called Braxton Hicks contractions, or false labor. Contractions may last for hours, days, or even weeks before true labor sets in. If contractions come at regular intervals, intensify, or become painful, it is best to be seen by your caregiver.  SIGNS OF LABOR  Menstrual-like cramps. Contractions that are 5 minutes apart or less. Contractions that start on the top of the uterus and spread down to the lower abdomen and back. A sense of increased pelvic pressure or back pain. A watery or bloody mucus discharge that comes from the vagina. If you have any of these signs before the 37th week of pregnancy, call your caregiver right away. You need to go to the hospital to get checked immediately. HOME CARE INSTRUCTIONS  Avoid all smoking, herbs, alcohol, and unprescribed drugs. These chemicals affect the formation and growth of the baby. Follow your caregiver's instructions regarding medicine use. There are medicines that are either safe or unsafe to take during pregnancy. Exercise only as directed by your caregiver. Experiencing uterine cramps is a good sign to  stop exercising. Continue to eat regular, healthy meals. Wear a good support bra for breast tenderness. Do not use hot tubs, steam rooms, or saunas. Wear your seat belt at all times when driving. Avoid raw meat, uncooked cheese, cat litter boxes, and soil used by cats. These carry germs that can cause birth defects in the baby. Take your prenatal vitamins. Try taking a stool softener (if your caregiver approves) if you develop constipation. Eat more high-fiber foods, such as fresh vegetables or fruit and whole grains. Drink plenty of fluids to keep your urine clear or pale yellow. Take warm sitz baths to soothe any pain or discomfort caused by hemorrhoids. Use hemorrhoid cream if   your caregiver approves. If you develop varicose veins, wear support hose. Elevate your feet for 15 minutes, 3-4 times a day. Limit salt in your diet. Avoid heavy lifting, wear low heal shoes, and practice good posture. Rest a lot with your legs elevated if you have leg cramps or low back pain. Visit your dentist if you have not gone during your pregnancy. Use a soft toothbrush to brush your teeth and be gentle when you floss. A sexual relationship may be continued unless your caregiver directs you otherwise. Do not travel far distances unless it is absolutely necessary and only with the approval of your caregiver. Take prenatal classes to understand, practice, and ask questions about the labor and delivery. Make a trial run to the hospital. Pack your hospital bag. Prepare the baby's nursery. Continue to go to all your prenatal visits as directed by your caregiver. SEEK MEDICAL CARE IF: You are unsure if you are in labor or if your water has broken. You have dizziness. You have mild pelvic cramps, pelvic pressure, or nagging pain in your abdominal area. You have persistent nausea, vomiting, or diarrhea. You have a bad smelling vaginal discharge. You have pain with urination. SEEK IMMEDIATE MEDICAL CARE IF:  You  have a fever. You are leaking fluid from your vagina. You have spotting or bleeding from your vagina. You have severe abdominal cramping or pain. You have rapid weight loss or gain. You have shortness of breath with chest pain. You notice sudden or extreme swelling of your face, hands, ankles, feet, or legs. You have not felt your baby move in over an hour. You have severe headaches that do not go away with medicine. You have vision changes. Document Released: 12/16/2000 Document Revised: 12/27/2012 Document Reviewed: 02/23/2012 ExitCare Patient Information 2015 [employer]. This information is not intended to replace advice given to you by your health care provider. Make sure you discuss any questions you have with your health care provider.       

## 2023-04-22 NOTE — Progress Notes (Signed)
 LOW-RISK PREGNANCY VISIT Patient name: Michelle Larson MRN [id number]  Date of birth: 1995-05-05 Chief Complaint:   Routine Prenatal Visit (Needs pap)  History of Present Illness:   Michelle Larson is a 28 y.o. [phone] female at [redacted]w[redacted]d with an Estimated Date of Delivery: 07/15/23 being seen today for ongoing management of a low-risk pregnancy.   Today she reports no complaints. Contractions: Not present. Vag. Bleeding: None.  Movement: Present. denies leaking of fluid.     01/05/2023    2:57 PM 10/28/2020    9:15 AM 12/07/2018    2:50 PM  Depression screen PHQ 2/9  Decreased Interest 0 0 0  Down, Depressed, Hopeless 0 0 0  PHQ - 2 Score 0 0 0  Altered sleeping 1    Tired, decreased energy 1    Change in appetite 0    Feeling bad or failure about yourself  0    Trouble concentrating 0    Moving slowly or fidgety/restless 0    Suicidal thoughts 0    PHQ-9 Score 2          01/05/2023    2:57 PM  GAD 7 : Generalized Anxiety Score  Nervous, Anxious, on Edge 0  Control/stop worrying 0  Worry too much - different things 0  Trouble relaxing 0  Restless 0  Easily annoyed or irritable 1  Afraid - awful might happen 0  Total GAD 7 Score 1      Review of Systems:   Pertinent items are noted in HPI Denies abnormal vaginal discharge w/ itching/odor/irritation, headaches, visual changes, shortness of breath, chest pain, abdominal pain, severe nausea/vomiting, or problems with urination or bowel movements unless otherwise stated above. Pertinent History Reviewed:  Reviewed past medical,surgical, social, obstetrical and family history.  Reviewed problem list, medications and allergies. Physical Assessment:   Vitals:   04/22/23 1008  BP: 103/65  Pulse: 89  Weight: 204 lb 8 oz (92.8 kg)  Body mass index is 37.4 kg/m.        Physical Examination:   General appearance: Well appearing, and in no distress  Mental status: Alert, oriented to person, place, and  time  Skin: Warm & dry  Cardiovascular: Normal heart rate noted  Respiratory: Normal respiratory effort, no distress  Abdomen: Soft, gravid, nontender  Pelvic:  thin prep pap obtained          Extremities: Edema: None  Fetal Status: Fetal Heart Rate (bpm): 160 Fundal Height: 28 cm Movement: Present    Chaperone: Malachy Mood No results found for this or any previous visit (from the past 24 hours).  Assessment & Plan:  1) Low-risk pregnancy [id number] at [redacted]w[redacted]d with an Estimated Date of Delivery: 07/15/23   2) Cervical cancer screen, pap today  3) H/O LGA  4) H/O PPH  5) PG BMI 36> EFW 32w  6) Wants BTS>reviewed risks/benefits, discussed high incidence regret <30yo if appropriate, LARCs just as effective, consent signed today    Meds: No orders of the defined types were placed in this encounter.  Labs/procedures today: pap and Tdap, did not get scheduled for PN2-schedule ASAP  Plan:  Continue routine obstetrical care  Next visit: prefers in person    Reviewed: Preterm labor symptoms and general obstetric precautions including but not limited to vaginal bleeding, contractions, leaking of fluid and fetal movement were reviewed in detail with the patient.  All questions were answered. Does have home bp cuff. Office bp cuff given: not applicable. Check bp weekly,  let us  know if consistently >140 and/or >90.  Follow-up: Return for asap pn2 (no visit), then 2wks LROB w/ CNM; then 4wks from now EFW and LROB w/ CNM.  No future appointments.  No orders of the defined types were placed in this encounter.  Ferd Householder CNM, [facility] 04/22/2023 10:28 AM

## 2023-04-28 ENCOUNTER — Encounter: Payer: Self-pay | Admitting: Women's Health

## 2023-04-28 ENCOUNTER — Other Ambulatory Visit

## 2023-04-28 DIAGNOSIS — Z131 Encounter for screening for diabetes mellitus: Secondary | ICD-10-CM

## 2023-04-28 DIAGNOSIS — Z3A28 28 weeks gestation of pregnancy: Secondary | ICD-10-CM

## 2023-04-28 DIAGNOSIS — R87619 Unspecified abnormal cytological findings in specimens from cervix uteri: Secondary | ICD-10-CM | POA: Insufficient documentation

## 2023-04-28 DIAGNOSIS — Z3480 Encounter for supervision of other normal pregnancy, unspecified trimester: Secondary | ICD-10-CM | POA: Diagnosis not present

## 2023-04-28 LAB — CYTOLOGY - PAP
Comment: NEGATIVE
Comment: NEGATIVE
Comment: NEGATIVE
Diagnosis: UNDETERMINED — AB
HPV 16: NEGATIVE
HPV 18 / 45: NEGATIVE
High risk HPV: POSITIVE — AB

## 2023-04-29 ENCOUNTER — Encounter: Payer: Self-pay | Admitting: Women's Health

## 2023-04-29 LAB — CBC
Hematocrit: 33.2 % — ABNORMAL LOW (ref 34.0–46.6)
Hemoglobin: 11.2 g/dL (ref 11.1–15.9)
MCH: 30.7 pg (ref 26.6–33.0)
MCHC: 33.7 g/dL (ref 31.5–35.7)
MCV: 91 fL (ref 79–97)
Platelets: 254 10*3/uL (ref 150–450)
RBC: 3.65 x10E6/uL — ABNORMAL LOW (ref 3.77–5.28)
RDW: 12.2 % (ref 11.7–15.4)
WBC: 7.4 10*3/uL (ref 3.4–10.8)

## 2023-04-29 LAB — GLUCOSE TOLERANCE, 2 HOURS W/ 1HR
Glucose, 1 hour: 142 mg/dL (ref 70–179)
Glucose, 2 hour: 111 mg/dL (ref 70–152)
Glucose, Fasting: 79 mg/dL (ref 70–91)

## 2023-04-29 LAB — RPR: RPR Ser Ql: NONREACTIVE

## 2023-04-29 LAB — HIV ANTIBODY (ROUTINE TESTING W REFLEX): HIV Screen 4th Generation wRfx: NONREACTIVE

## 2023-04-29 LAB — ANTIBODY SCREEN: Antibody Screen: NEGATIVE

## 2023-05-06 ENCOUNTER — Encounter: Payer: Self-pay | Admitting: Women's Health

## 2023-05-06 ENCOUNTER — Ambulatory Visit: Admitting: Women's Health

## 2023-05-06 VITALS — BP 109/64 | HR 80 | Wt 205.4 lb

## 2023-05-06 DIAGNOSIS — Z3483 Encounter for supervision of other normal pregnancy, third trimester: Secondary | ICD-10-CM | POA: Diagnosis not present

## 2023-05-06 DIAGNOSIS — Z3480 Encounter for supervision of other normal pregnancy, unspecified trimester: Secondary | ICD-10-CM

## 2023-05-06 DIAGNOSIS — Z3A30 30 weeks gestation of pregnancy: Secondary | ICD-10-CM | POA: Diagnosis not present

## 2023-05-06 DIAGNOSIS — Z6835 Body mass index (BMI) 35.0-35.9, adult: Secondary | ICD-10-CM

## 2023-05-06 NOTE — Progress Notes (Signed)
 LOW-RISK PREGNANCY VISIT Patient name: Michelle Larson MRN [id number]  Date of birth: 02-03-1995 Chief Complaint:   Routine Prenatal Visit  History of Present Illness:   Michelle Larson is a 28 y.o. [id number] female at [redacted]w[redacted]d with an Estimated Date of Delivery: 07/15/23 being seen today for ongoing management of a low-risk pregnancy.   Today she reports no complaints. Contractions: Not present. Vag. Bleeding: None.  Movement: Present. denies leaking of fluid.     01/05/2023    2:57 PM 10/28/2020    9:15 AM 12/07/2018    2:50 PM  Depression screen PHQ 2/9  Decreased Interest 0 0 0  Down, Depressed, Hopeless 0 0 0  PHQ - 2 Score 0 0 0  Altered sleeping 1    Tired, decreased energy 1    Change in appetite 0    Feeling bad or failure about yourself  0    Trouble concentrating 0    Moving slowly or fidgety/restless 0    Suicidal thoughts 0    PHQ-9 Score 2          01/05/2023    2:57 PM  GAD 7 : Generalized Anxiety Score  Nervous, Anxious, on Edge 0  Control/stop worrying 0  Worry too much - different things 0  Trouble relaxing 0  Restless 0  Easily annoyed or irritable 1  Afraid - awful might happen 0  Total GAD 7 Score 1      Review of Systems:   Pertinent items are noted in HPI Denies abnormal vaginal discharge w/ itching/odor/irritation, headaches, visual changes, shortness of breath, chest pain, abdominal pain, severe nausea/vomiting, or problems with urination or bowel movements unless otherwise stated above. Pertinent History Reviewed:  Reviewed past medical,surgical, social, obstetrical and family history.  Reviewed problem list, medications and allergies. Physical Assessment:   Vitals:   05/06/23 1025  BP: 109/64  Pulse: 80  Weight: 205 lb 6.4 oz (93.2 kg)  Body mass index is 37.57 kg/m.        Physical Examination:   General appearance: Well appearing, and in no distress  Mental status: Alert, oriented to person, place, and time  Skin: Warm &  dry  Cardiovascular: Normal heart rate noted  Respiratory: Normal respiratory effort, no distress  Abdomen: Soft, gravid, nontender  Pelvic: Cervical exam deferred         Extremities:    Fetal Status: Fetal Heart Rate (bpm): 147 Fundal Height: 29 cm Movement: Present    Chaperone: N/A No results found for this or any previous visit (from the past 24 hours).  Assessment & Plan:  1) Low-risk pregnancy [id number] at [redacted]w[redacted]d with an Estimated Date of Delivery: 07/15/23   2) H/O LGA, no GDM  3) H/O PPH> w/ 3rd  4) PGBMI 36> EFW next visit as scheduled   Meds: No orders of the defined types were placed in this encounter.  Labs/procedures today: none  Plan:  Continue routine obstetrical care  Next visit: prefers will be in person for u/s     Reviewed: Preterm labor symptoms and general obstetric precautions including but not limited to vaginal bleeding, contractions, leaking of fluid and fetal movement were reviewed in detail with the patient.  All questions were answered. Does have home bp cuff. Office bp cuff given: not applicable. Check bp weekly, let us  know if consistently >140 and/or >90.  Follow-up: Return for As scheduled.  Future Appointments  Date Time Provider Department Center  05/24/2023  1:30 PM [facility] 2 [facility] None  05/24/2023  2:50 PM [place] Kocher, Ema Hands, CNM [facility]    Orders Placed This Encounter  Procedures   US  OB Follow Up   Ferd Householder CNM, [facility] 05/06/2023 10:57 AM

## 2023-05-06 NOTE — Patient Instructions (Signed)
Michelle Larson, thank you for choosing our office today! We appreciate the opportunity to meet your healthcare needs. You may receive a short survey by mail, e-mail, or through [employer]. If you are happy with your care we would appreciate if you could take just a few minutes to complete the survey questions. We read all of your comments and take your feedback very seriously. Thank you again for choosing our office.  Center for [employer] Team at [facility] ([place], [place] [address]) Entrance C, located off of [address] parking   CLASSES: Go to [email or web address] to register for classes (childbirth, breastfeeding, waterbirth, infant CPR, daddy bootcamp, etc.)  Call the office [phone]) or go to [facility] if: You begin to have strong, frequent contractions Your water breaks.  Sometimes it is a big gush of fluid, sometimes it is just a trickle that keeps getting your panties wet or running down your legs You have vaginal bleeding.  It is normal to have a small amount of spotting if your cervix was checked.  You don't feel your baby moving like normal.  If you don't, get you something to eat and drink and lay down and focus on feeling your baby move.   If your baby is still not moving like normal, you should call the office or go to [facility].  Call the office [phone]) or go to [facility] hospital for these signs of pre-eclampsia: Severe headache that does not go away with Tylenol Visual changes- seeing spots, double, blurred vision Pain under your right breast or upper abdomen that does not go away with Tums or heartburn medicine Nausea and/or vomiting Severe swelling in your hands, feet, and face   Tdap Vaccine It is recommended that you get the Tdap vaccine during the third trimester of EACH pregnancy to help protect your baby from getting pertussis (whooping cough) 27-36 weeks is the BEST time to do  this so that you can pass the protection on to your baby. During pregnancy is better than after pregnancy, but if you are unable to get it during pregnancy it will be offered at the hospital.  You can get this vaccine with [place], at the health department, your family doctor, or some local pharmacies Everyone who will be around your baby should also be up-to-date on their vaccines before the baby comes. Adults (who are not pregnant) only need 1 dose of Tdap during adulthood.   [facility] Pediatricians/Family Doctors [facility]): [address] [place], [place] [facility]: [address] [place], [phone]                [facility]): [place], [phone] (call to ask if accepting patients) [facility] Department: [address], [place], [place] Pediatricians/Family Doctors [facility]): [place] [place], [address], [place] [facility]: [address] [place], [place] [facility]: [place], [place] [facility]): [phone] [facility]: [address] [place] [address] [place]: [address] [place] [facility]: [phone], [phone]  Home Blood Pressure Monitoring for Patients   Your provider has recommended that you check your  blood pressure (BP) at least once a week at home. If you do not have a blood pressure cuff at home, one will be provided for you. Contact your provider if you have not received your monitor within 1 week.   Helpful Tips for Accurate Home Blood Pressure Checks  Don't smoke, exercise, or drink caffeine 30 minutes before checking your BP Use the restroom before checking your BP (a full bladder can raise your  pressure) Relax in a comfortable upright chair Feet on the ground Left arm resting comfortably on a flat surface at the level of your heart Legs uncrossed Back supported Sit quietly and don't talk Place the cuff on your bare arm Adjust snuggly, so that only two fingertips can fit between your skin and the top of the cuff Check 2 readings separated by at least one minute Keep a log of your BP readings For a visual, please reference this diagram: http://ccnc.care/bpdiagram  Provider Name: [facility]     Phone: [phone]  Zone 1: ALL CLEAR  Continue to monitor your symptoms:  BP reading is less than 140 (top number) or less than 90 (bottom number)  No right upper stomach pain No headaches or seeing spots No feeling nauseated or throwing up No swelling in face and hands  Zone 2: CAUTION Call your doctor's office for any of the following:  BP reading is greater than 140 (top number) or greater than 90 (bottom number)  Stomach pain under your ribs in the middle or right side Headaches or seeing spots Feeling nauseated or throwing up Swelling in face and hands  Zone 3: EMERGENCY  Seek immediate medical care if you have any of the following:  BP reading is greater than160 (top number) or greater than 110 (bottom number) Severe headaches not improving with Tylenol Serious difficulty catching your breath Any worsening symptoms from Zone 2  Preterm Labor and Birth Information  The normal length of a pregnancy is 39-41 weeks. Preterm labor is when labor starts before 37 completed weeks of pregnancy. What are the risk factors for preterm labor? Preterm labor is more likely to occur in women who: Have certain infections during pregnancy such as a bladder infection, sexually transmitted infection, or infection inside the uterus (chorioamnionitis). Have a shorter-than-normal cervix. Have gone into preterm labor before. Have had surgery on their cervix. Are younger than age 17  or older than age 35. Are African American. Are pregnant with twins or multiple babies (multiple gestation). Take street drugs or smoke while pregnant. Do not gain enough weight while pregnant. Became pregnant shortly after having been pregnant. What are the symptoms of preterm labor? Symptoms of preterm labor include: Cramps similar to those that can happen during a menstrual period. The cramps may happen with diarrhea. Pain in the abdomen or lower back. Regular uterine contractions that may feel like tightening of the abdomen. A feeling of increased pressure in the pelvis. Increased watery or bloody mucus discharge from the vagina. Water breaking (ruptured amniotic sac). Why is it important to recognize signs of preterm labor? It is important to recognize signs of preterm labor because babies who are born prematurely may not be fully developed. This can put them at an increased risk for: Long-term (chronic) heart and lung problems. Difficulty immediately after birth with regulating body systems, including blood sugar, body temperature, heart rate, and breathing rate. Bleeding in the brain. Cerebral palsy. Learning difficulties. Death. These risks are highest for babies who are born before 34 weeks   of pregnancy. How is preterm labor treated? Treatment depends on the length of your pregnancy, your condition, and the health of your baby. It may involve: Having a stitch (suture) placed in your cervix to prevent your cervix from opening too early (cerclage). Taking or being given medicines, such as: Hormone medicines. These may be given early in pregnancy to help support the pregnancy. Medicine to stop contractions. Medicines to help mature the baby's lungs. These may be prescribed if the risk of delivery is high. Medicines to prevent your baby from developing cerebral palsy. If the labor happens before 34 weeks of pregnancy, you may need to stay in the hospital. What should I do if I  think I am in preterm labor? If you think that you are going into preterm labor, call your health care provider right away. How can I prevent preterm labor in future pregnancies? To increase your chance of having a full-term pregnancy: Do not use any tobacco products, such as cigarettes, chewing tobacco, and e-cigarettes. If you need help quitting, ask your health care provider. Do not use street drugs or medicines that have not been prescribed to you during your pregnancy. Talk with your health care provider before taking any herbal supplements, even if you have been taking them regularly. Make sure you gain a healthy amount of weight during your pregnancy. Watch for infection. If you think that you might have an infection, get it checked right away. Make sure to tell your health care provider if you have gone into preterm labor before. This information is not intended to replace advice given to you by your health care provider. Make sure you discuss any questions you have with your health care provider. Document Revised: 04/15/2018 Document Reviewed: 05/15/2015 Elsevier Patient Education  2020 Elsevier Inc.   

## 2023-05-24 ENCOUNTER — Ambulatory Visit: Admitting: Radiology

## 2023-05-24 ENCOUNTER — Ambulatory Visit: Admitting: Women's Health

## 2023-05-24 ENCOUNTER — Encounter: Payer: Self-pay | Admitting: Women's Health

## 2023-05-24 VITALS — BP 120/76 | HR 80 | Wt 210.0 lb

## 2023-05-24 DIAGNOSIS — Z3480 Encounter for supervision of other normal pregnancy, unspecified trimester: Secondary | ICD-10-CM

## 2023-05-24 DIAGNOSIS — Z3483 Encounter for supervision of other normal pregnancy, third trimester: Secondary | ICD-10-CM | POA: Diagnosis not present

## 2023-05-24 DIAGNOSIS — Z3A32 32 weeks gestation of pregnancy: Secondary | ICD-10-CM

## 2023-05-24 DIAGNOSIS — Z6835 Body mass index (BMI) 35.0-35.9, adult: Secondary | ICD-10-CM | POA: Diagnosis not present

## 2023-05-24 NOTE — Patient Instructions (Signed)
Michelle Larson, thank you for choosing our office today! We appreciate the opportunity to meet your healthcare needs. You may receive a short survey by mail, e-mail, or through [employer]. If you are happy with your care we would appreciate if you could take just a few minutes to complete the survey questions. We read all of your comments and take your feedback very seriously. Thank you again for choosing our office.  Center for [employer] Team at [facility] ([place], [place] [address]) Entrance C, located off of [address] parking   CLASSES: Go to [email or web address] to register for classes (childbirth, breastfeeding, waterbirth, infant CPR, daddy bootcamp, etc.)  Call the office [phone]) or go to [facility] if: You begin to have strong, frequent contractions Your water breaks.  Sometimes it is a big gush of fluid, sometimes it is just a trickle that keeps getting your panties wet or running down your legs You have vaginal bleeding.  It is normal to have a small amount of spotting if your cervix was checked.  You don't feel your baby moving like normal.  If you don't, get you something to eat and drink and lay down and focus on feeling your baby move.   If your baby is still not moving like normal, you should call the office or go to [facility].  Call the office [phone]) or go to [facility] hospital for these signs of pre-eclampsia: Severe headache that does not go away with Tylenol Visual changes- seeing spots, double, blurred vision Pain under your right breast or upper abdomen that does not go away with Tums or heartburn medicine Nausea and/or vomiting Severe swelling in your hands, feet, and face   Tdap Vaccine It is recommended that you get the Tdap vaccine during the third trimester of EACH pregnancy to help protect your baby from getting pertussis (whooping cough) 27-36 weeks is the BEST time to do  this so that you can pass the protection on to your baby. During pregnancy is better than after pregnancy, but if you are unable to get it during pregnancy it will be offered at the hospital.  You can get this vaccine with [place], at the health department, your family doctor, or some local pharmacies Everyone who will be around your baby should also be up-to-date on their vaccines before the baby comes. Adults (who are not pregnant) only need 1 dose of Tdap during adulthood.   [facility] Pediatricians/Family Doctors [facility]): [address] [place], [place] [facility]: [address] [place], [phone]                [facility]): [place], [phone] (call to ask if accepting patients) [facility] Department: [address], [place], [place] Pediatricians/Family Doctors [facility]): [place] [place], [address], [place] [facility]: [address] [place], [place] [facility]: [place], [place] [facility]): [phone] [facility]: [address] [place] [address] [place]: [address] [place] [facility]: [phone], [phone]  Home Blood Pressure Monitoring for Patients   Your provider has recommended that you check your  blood pressure (BP) at least once a week at home. If you do not have a blood pressure cuff at home, one will be provided for you. Contact your provider if you have not received your monitor within 1 week.   Helpful Tips for Accurate Home Blood Pressure Checks  Don't smoke, exercise, or drink caffeine 30 minutes before checking your BP Use the restroom before checking your BP (a full bladder can raise your  pressure) Relax in a comfortable upright chair Feet on the ground Left arm resting comfortably on a flat surface at the level of your heart Legs uncrossed Back supported Sit quietly and don't talk Place the cuff on your bare arm Adjust snuggly, so that only two fingertips can fit between your skin and the top of the cuff Check 2 readings separated by at least one minute Keep a log of your BP readings For a visual, please reference this diagram: http://ccnc.care/bpdiagram  Provider Name: [facility]     Phone: [phone]  Zone 1: ALL CLEAR  Continue to monitor your symptoms:  BP reading is less than 140 (top number) or less than 90 (bottom number)  No right upper stomach pain No headaches or seeing spots No feeling nauseated or throwing up No swelling in face and hands  Zone 2: CAUTION Call your doctor's office for any of the following:  BP reading is greater than 140 (top number) or greater than 90 (bottom number)  Stomach pain under your ribs in the middle or right side Headaches or seeing spots Feeling nauseated or throwing up Swelling in face and hands  Zone 3: EMERGENCY  Seek immediate medical care if you have any of the following:  BP reading is greater than160 (top number) or greater than 110 (bottom number) Severe headaches not improving with Tylenol Serious difficulty catching your breath Any worsening symptoms from Zone 2  Preterm Labor and Birth Information  The normal length of a pregnancy is 39-41 weeks. Preterm labor is when labor starts before 37 completed weeks of pregnancy. What are the risk factors for preterm labor? Preterm labor is more likely to occur in women who: Have certain infections during pregnancy such as a bladder infection, sexually transmitted infection, or infection inside the uterus (chorioamnionitis). Have a shorter-than-normal cervix. Have gone into preterm labor before. Have had surgery on their cervix. Are younger than age 17  or older than age 35. Are African American. Are pregnant with twins or multiple babies (multiple gestation). Take street drugs or smoke while pregnant. Do not gain enough weight while pregnant. Became pregnant shortly after having been pregnant. What are the symptoms of preterm labor? Symptoms of preterm labor include: Cramps similar to those that can happen during a menstrual period. The cramps may happen with diarrhea. Pain in the abdomen or lower back. Regular uterine contractions that may feel like tightening of the abdomen. A feeling of increased pressure in the pelvis. Increased watery or bloody mucus discharge from the vagina. Water breaking (ruptured amniotic sac). Why is it important to recognize signs of preterm labor? It is important to recognize signs of preterm labor because babies who are born prematurely may not be fully developed. This can put them at an increased risk for: Long-term (chronic) heart and lung problems. Difficulty immediately after birth with regulating body systems, including blood sugar, body temperature, heart rate, and breathing rate. Bleeding in the brain. Cerebral palsy. Learning difficulties. Death. These risks are highest for babies who are born before 34 weeks   of pregnancy. How is preterm labor treated? Treatment depends on the length of your pregnancy, your condition, and the health of your baby. It may involve: Having a stitch (suture) placed in your cervix to prevent your cervix from opening too early (cerclage). Taking or being given medicines, such as: Hormone medicines. These may be given early in pregnancy to help support the pregnancy. Medicine to stop contractions. Medicines to help mature the baby's lungs. These may be prescribed if the risk of delivery is high. Medicines to prevent your baby from developing cerebral palsy. If the labor happens before 34 weeks of pregnancy, you may need to stay in the hospital. What should I do if I  think I am in preterm labor? If you think that you are going into preterm labor, call your health care provider right away. How can I prevent preterm labor in future pregnancies? To increase your chance of having a full-term pregnancy: Do not use any tobacco products, such as cigarettes, chewing tobacco, and e-cigarettes. If you need help quitting, ask your health care provider. Do not use street drugs or medicines that have not been prescribed to you during your pregnancy. Talk with your health care provider before taking any herbal supplements, even if you have been taking them regularly. Make sure you gain a healthy amount of weight during your pregnancy. Watch for infection. If you think that you might have an infection, get it checked right away. Make sure to tell your health care provider if you have gone into preterm labor before. This information is not intended to replace advice given to you by your health care provider. Make sure you discuss any questions you have with your health care provider. Document Revised: 04/15/2018 Document Reviewed: 05/15/2015 Elsevier Patient Education  2020 Elsevier Inc.   

## 2023-05-24 NOTE — Progress Notes (Signed)
 US :  GA = 32+4 weeks Single active female fetus, cephalic, FHR=134 bpm, anterior pl,gr1,  AFI = 15.6 cm, MVP = 5.2 cm  EFW 74.9% AC 85% 2258g, nl ov's

## 2023-05-24 NOTE — Progress Notes (Signed)
 LOW-RISK PREGNANCY VISIT Patient name: Michelle Larson MRN [id number]  Date of birth: Apr 29, 1995 Chief Complaint:   Routine Prenatal Visit and Pregnancy Ultrasound  History of Present Illness:   Michelle Larson is a 28 y.o. [id number] female at [redacted]w[redacted]d with an Estimated Date of Delivery: 07/15/23 being seen today for ongoing management of a low-risk pregnancy.   Today she reports heartburn/reflux, hasn't tried anything. Contractions: Not present.  .  Movement: Present. denies leaking of fluid.     01/05/2023    2:57 PM 10/28/2020    9:15 AM 12/07/2018    2:50 PM  Depression screen PHQ 2/9  Decreased Interest 0 0 0  Down, Depressed, Hopeless 0 0 0  PHQ - 2 Score 0 0 0  Altered sleeping 1    Tired, decreased energy 1    Change in appetite 0    Feeling bad or failure about yourself  0    Trouble concentrating 0    Moving slowly or fidgety/restless 0    Suicidal thoughts 0    PHQ-9 Score 2          01/05/2023    2:57 PM  GAD 7 : Generalized Anxiety Score  Nervous, Anxious, on Edge 0  Control/stop worrying 0  Worry too much - different things 0  Trouble relaxing 0  Restless 0  Easily annoyed or irritable 1  Afraid - awful might happen 0  Total GAD 7 Score 1      Review of Systems:   Pertinent items are noted in HPI Denies abnormal vaginal discharge w/ itching/odor/irritation, headaches, visual changes, shortness of breath, chest pain, abdominal pain, severe nausea/vomiting, or problems with urination or bowel movements unless otherwise stated above. Pertinent History Reviewed:  Reviewed past medical,surgical, social, obstetrical and family history.  Reviewed problem list, medications and allergies. Physical Assessment:   Vitals:   05/24/23 1354  BP: 120/76  Pulse: 80  Weight: 210 lb (95.3 kg)  Body mass index is 38.41 kg/m.        Physical Examination:   General appearance: Well appearing, and in no distress  Mental status: Alert, oriented to person, place,  and time  Skin: Warm & dry  Cardiovascular: Normal heart rate noted  Respiratory: Normal respiratory effort, no distress  Abdomen: Soft, gravid, nontender  Pelvic: Cervical exam deferred         Extremities: Edema: None  Fetal Status:     Movement: Present  US :  GA = 32+4 weeks Single active female fetus, cephalic, FHR=134 bpm, anterior pl,gr1,  AFI = 15.6 cm, MVP = 5.2 cm  EFW 74.9% AC 85% 2258g, nl ov's  Chaperone: N/A No results found for this or any previous visit (from the past 24 hours).  Assessment & Plan:  1) Low-risk pregnancy [id number] at [redacted]w[redacted]d with an Estimated Date of Delivery: 07/15/23   2) Heartburn, try TUMS, let us  know if doesn't help  3) H/O PPH w/ 3rd  4) H/O LGA> EFW 75% today, wants to repeat @ 36w   Meds: No orders of the defined types were placed in this encounter.  Labs/procedures today: U/S  Plan:  Continue routine obstetrical care  Next visit: prefers in person    Reviewed: Preterm labor symptoms and general obstetric precautions including but not limited to vaginal bleeding, contractions, leaking of fluid and fetal movement were reviewed in detail with the patient.  All questions were answered. Does have home bp cuff. Office bp cuff given: not applicable. Check bp weekly,  let us  know if consistently >140 and/or >90.  Follow-up: Return in about 2 weeks (around 06/07/2023) for LROB, CNM, in person; then 4wks from now EFW u/s and LROB w/ CNM.  Future Appointments  Date Time Provider Department Center  05/24/2023  2:50 PM Ferd Householder, [place] [facility]  06/08/2023 10:10 AM Zelma Hidden, FNP [facility]    No orders of the defined types were placed in this encounter.  Ferd Householder CNM, [facility] 05/24/2023 2:31 PM

## 2023-05-29 IMAGING — DX DG CHEST 1V PORT
1 series · 1 of 1 positions shown · non-contrast
Comparison: 10/07/2020

CLINICAL DATA: Chest pain

EXAM:
PORTABLE CHEST 1 VIEW

[chest ap]
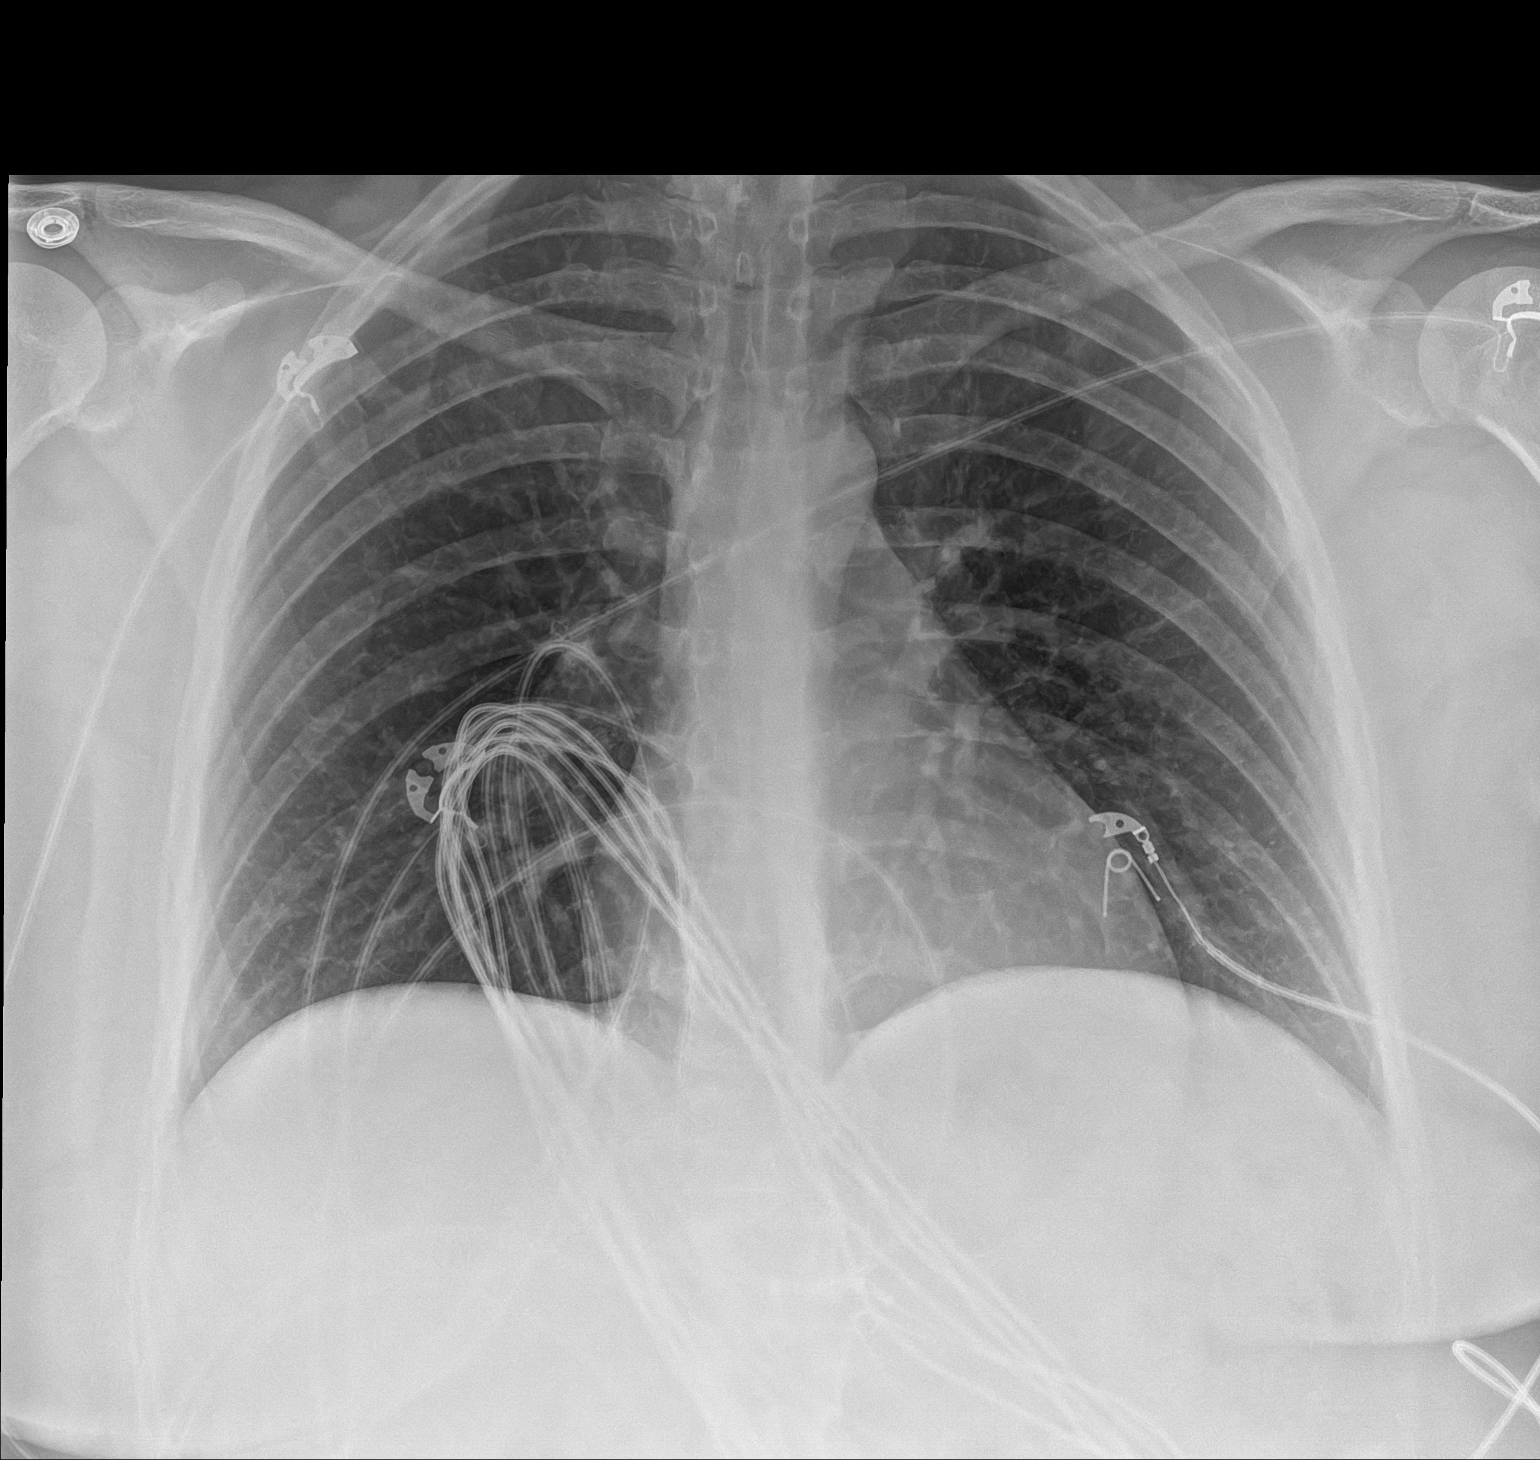

[1 of 1 positions shown; findings below may reference images not displayed]

FINDINGS: Cardiac and mediastinal contours are within normal limits. No focal
pulmonary opacity. No pleural effusion or pneumothorax. No acute
osseous abnormality.
IMPRESSION: No acute cardiopulmonary process.

## 2023-06-08 ENCOUNTER — Ambulatory Visit: Admitting: Obstetrics and Gynecology

## 2023-06-08 VITALS — BP 112/64 | HR 77 | Wt 209.0 lb

## 2023-06-08 DIAGNOSIS — Z3A34 34 weeks gestation of pregnancy: Secondary | ICD-10-CM | POA: Diagnosis not present

## 2023-06-08 DIAGNOSIS — O09893 Supervision of other high risk pregnancies, third trimester: Secondary | ICD-10-CM

## 2023-06-08 DIAGNOSIS — Z3483 Encounter for supervision of other normal pregnancy, third trimester: Secondary | ICD-10-CM

## 2023-06-08 DIAGNOSIS — Z8759 Personal history of other complications of pregnancy, childbirth and the puerperium: Secondary | ICD-10-CM

## 2023-06-08 NOTE — Progress Notes (Signed)
   PRENATAL VISIT NOTE  Subjective:  Michelle Larson is a 28 y.o. [phone] at [redacted]w[redacted]d being seen today for ongoing prenatal care.  She is currently monitored for the following issues for this low-risk pregnancy and has Encounter for supervision of normal pregnancy, antepartum; History of postpartum hemorrhage; BMI 36.0-36.9,adult; Asymptomatic bacteriuria during pregnancy in first trimester; GBS (group b Streptococcus) UTI complicating pregnancy, second trimester; and Abnormal Pap smear of cervix on their problem list.  Patient reports no complaints.  Contractions: Not present. Vag. Bleeding: None.  Movement: Present. Denies leaking of fluid.   The following portions of the patient's history were reviewed and updated as appropriate: allergies, current medications, past family history, past medical history, past social history, past surgical history and problem list.   Objective:    Vitals:   06/08/23 1019  BP: 112/64  Pulse: 77  Weight: 209 lb (94.8 kg)    Fetal Status:  Fetal Heart Rate (bpm): 148 Fundal Height: 34 cm Movement: Present    General: Alert, oriented and cooperative. Patient is in no acute distress.  Skin: Skin is warm and dry. No rash noted.   Cardiovascular: Normal heart rate noted  Respiratory: Normal respiratory effort, no problems with respiration noted  Abdomen: Soft, gravid, appropriate for gestational age.  Pain/Pressure: Absent     Pelvic: Cervical exam deferred        Extremities: Normal range of motion.     Mental Status: Normal mood and affect. Normal behavior. Normal judgment and thought content.   Assessment and Plan:  Pregnancy: [id number] at [redacted]w[redacted]d 1. Encounter for supervision of other normal pregnancy in third trimester (Primary) BP and FHR normal Doing well, feeling regular movement    2. History of postpartum hemorrhage  3. History of LGA Normal growth to date, follow up next visit   4. [redacted] weeks gestation of pregnancy Anticipatory guidance  regarding swabs next visit   Preterm labor symptoms and general obstetric precautions including but not limited to vaginal bleeding, contractions, leaking of fluid and fetal movement were reviewed in detail with the patient. Please refer to After Visit Summary for other counseling recommendations.   Return in 2 weeks for routine prenatal  Future Appointments  Date Time Provider Department Center  06/22/2023  9:15 AM [facility] 2 [facility] None  06/22/2023 10:10 AM Ferd Householder, CNM [facility]    Susi Eric, FNP

## 2023-06-09 ENCOUNTER — Other Ambulatory Visit: Payer: Self-pay | Admitting: Women's Health

## 2023-06-09 ENCOUNTER — Encounter: Payer: Self-pay | Admitting: Women's Health

## 2023-06-09 MED ORDER — PANTOPRAZOLE SODIUM 20 MG PO TBEC: 20.0000 mg | DELAYED_RELEASE_TABLET | Freq: Every day | ORAL | 2 refills | Status: DC

## 2023-06-15 ENCOUNTER — Other Ambulatory Visit: Payer: Self-pay | Admitting: Obstetrics & Gynecology

## 2023-06-15 DIAGNOSIS — Z6838 Body mass index (BMI) 38.0-38.9, adult: Secondary | ICD-10-CM

## 2023-06-15 DIAGNOSIS — O09299 Supervision of pregnancy with other poor reproductive or obstetric history, unspecified trimester: Secondary | ICD-10-CM

## 2023-06-22 ENCOUNTER — Encounter: Payer: Self-pay | Admitting: Women's Health

## 2023-06-22 ENCOUNTER — Ambulatory Visit: Admitting: Radiology

## 2023-06-22 ENCOUNTER — Other Ambulatory Visit (HOSPITAL_COMMUNITY)
Admission: RE | Admit: 2023-06-22 | Discharge: 2023-06-22 | Disposition: A | Discharge status: Home / Self Care | Source: Ambulatory Visit | Attending: Obstetrics & Gynecology | Admitting: Obstetrics & Gynecology

## 2023-06-22 ENCOUNTER — Ambulatory Visit: Admitting: Women's Health

## 2023-06-22 VITALS — BP 105/69 | HR 79 | Wt 210.0 lb

## 2023-06-22 DIAGNOSIS — Z3480 Encounter for supervision of other normal pregnancy, unspecified trimester: Secondary | ICD-10-CM

## 2023-06-22 DIAGNOSIS — Z113 Encounter for screening for infections with a predominantly sexual mode of transmission: Secondary | ICD-10-CM | POA: Insufficient documentation

## 2023-06-22 DIAGNOSIS — Z3A36 36 weeks gestation of pregnancy: Secondary | ICD-10-CM | POA: Diagnosis not present

## 2023-06-22 DIAGNOSIS — Z6838 Body mass index (BMI) 38.0-38.9, adult: Secondary | ICD-10-CM

## 2023-06-22 DIAGNOSIS — O09299 Supervision of pregnancy with other poor reproductive or obstetric history, unspecified trimester: Secondary | ICD-10-CM

## 2023-06-22 DIAGNOSIS — O09293 Supervision of pregnancy with other poor reproductive or obstetric history, third trimester: Secondary | ICD-10-CM

## 2023-06-22 DIAGNOSIS — Z3483 Encounter for supervision of other normal pregnancy, third trimester: Secondary | ICD-10-CM

## 2023-06-22 NOTE — Progress Notes (Signed)
 LOW-RISK PREGNANCY VISIT Patient name: Michelle Larson MRN [id number]  Date of birth: 01-06-1995 Chief Complaint:   Routine Prenatal Visit (culture) and Pregnancy Ultrasound  History of Present Illness:   Michelle Larson is a 28 y.o. [id number] female at [redacted]w[redacted]d with an Estimated Date of Delivery: 07/15/23 being seen today for ongoing management of a low-risk pregnancy.   Today she reports no complaints. Contractions: Not present.  .  Movement: Present. denies leaking of fluid.     01/05/2023    2:57 PM 10/28/2020    9:15 AM 12/07/2018    2:50 PM  Depression screen PHQ 2/9  Decreased Interest 0 0 0  Down, Depressed, Hopeless 0 0 0  PHQ - 2 Score 0 0 0  Altered sleeping 1    Tired, decreased energy 1    Change in appetite 0    Feeling bad or failure about yourself  0    Trouble concentrating 0    Moving slowly or fidgety/restless 0    Suicidal thoughts 0    PHQ-9 Score 2          01/05/2023    2:57 PM  GAD 7 : Generalized Anxiety Score  Nervous, Anxious, on Edge 0  Control/stop worrying 0  Worry too much - different things 0  Trouble relaxing 0  Restless 0  Easily annoyed or irritable 1  Afraid - awful might happen 0  Total GAD 7 Score 1      Review of Systems:   Pertinent items are noted in HPI Denies abnormal vaginal discharge w/ itching/odor/irritation, headaches, visual changes, shortness of breath, chest pain, abdominal pain, severe nausea/vomiting, or problems with urination or bowel movements unless otherwise stated above. Pertinent History Reviewed:  Reviewed past medical,surgical, social, obstetrical and family history.  Reviewed problem list, medications and allergies. Physical Assessment:   Vitals:   06/22/23 0949  BP: 105/69  Pulse: 79  Weight: 210 lb (95.3 kg)  Body mass index is 38.41 kg/m.        Physical Examination:   General appearance: Well appearing, and in no distress  Mental status: Alert, oriented to person, place, and  time  Skin: Warm & dry  Cardiovascular: Normal heart rate noted  Respiratory: Normal respiratory effort, no distress  Abdomen: Soft, gravid, nontender  Pelvic: Cervical exam performed  Dilation: 3 Effacement (%): Thick Station: Ballotable  Extremities: Edema: None  Fetal Status: Fetal Heart Rate (bpm): +u/s   Movement: Present Presentation: Vertex US : GA = 36+5 weeks Single active female fetus, cephalic, FHR = 159 bpm, anterior pl, gr1, AFI = 12.7 cm, 40%, MVP = 4 cm, EFW 81%, AC@95 % 3323g  Chaperone: Peggy Dones No results found for this or any previous visit (from the past 24 hours).  Assessment & Plan:  1) Low-risk pregnancy [id number] at [redacted]w[redacted]d with an Estimated Date of Delivery: 07/15/23   2) H/O LGA, EFW today 81%  3) H/O PPH   Meds: No orders of the defined types were placed in this encounter.  Labs/procedures today: GC/CT, SVE, and U/S (GBS+urine)  Plan:  Continue routine obstetrical care  Next visit: prefers in person    Reviewed: Preterm labor symptoms and general obstetric precautions including but not limited to vaginal bleeding, contractions, leaking of fluid and fetal movement were reviewed in detail with the patient.  All questions were answered. Does have home bp cuff. Office bp cuff given: not applicable. Check bp weekly, let us  know if consistently >140 and/or >90.  Follow-up: Return  for weekly LROB w/ CNM.  Future Appointments  Date Time Provider Department Center  06/22/2023 10:10 AM Ferd Householder, CNM [facility]    No orders of the defined types were placed in this encounter.  Ferd Householder CNM, [facility] 06/22/2023 10:06 AM

## 2023-06-22 NOTE — Patient Instructions (Signed)
 Dewey Fordyce, thank you for choosing our office today! We appreciate the opportunity to meet your healthcare needs. You may receive a short survey by mail, e-mail, or through [employer]. If you are happy with your care we would appreciate if you could take just a few minutes to complete the survey questions. We read all of your comments and take your feedback very seriously. Thank you again for choosing our office.  Center for [employer] Team at [facility]  [facility] at [facility] ([address] [place], [place] [address]) Entrance C, located off of [address] Free 24/7 valet parking   CLASSES: Go to [email or web address] to register for classes (childbirth, breastfeeding, waterbirth, infant CPR, daddy bootcamp, etc.)  Call the office [phone]) or go to [facility] if: You begin to have strong, frequent contractions Your water breaks.  Sometimes it is a big gush of fluid, sometimes it is just a trickle that keeps getting your panties wet or running down your legs You have vaginal bleeding.  It is normal to have a small amount of spotting if your cervix was checked.  You don't feel your baby moving like normal.  If you don't, get you something to eat and drink and lay down and focus on feeling your baby move.   If your baby is still not moving like normal, you should call the office or go to [facility].  Call the office [phone]) or go to [facility] hospital for these signs of pre-eclampsia: Severe headache that does not go away with Tylenol  Visual changes- seeing spots, double, blurred vision Pain under your right breast or upper abdomen that does not go away with Tums or heartburn medicine Nausea and/or vomiting Severe swelling in your hands, feet, and face   [place] [facility] Doctors [facility]): [address] Meg Spina, [phone]           [facility]: [address], [phone]                 [facility]): [address], [phone] (call to ask if accepting patients) [facility] Department: [address] [place], [phone]    [facility] Pediatricians/Family Doctors [facility]): [address], [phone] [facility]: [address] [place], [phone] [facility]: [address], [phone]  [facility] Doctors  [facility]): [phone] [facility]: [address], [phone]   [facility] Doctors [facility]: [address] [phone]  [facility] Doctors  [facility]: [phone], [phone]  Home Blood Pressure Monitoring for Patients   Your provider has recommended that you check your blood pressure (BP) at least once a week at home. If you do not have a blood pressure cuff at home, one will be provided for you. Contact your provider if you have not received your monitor within 1 week.   Helpful Tips for Accurate Home Blood Pressure Checks  Don't smoke, exercise, or drink caffeine 30 minutes before checking your BP Use the restroom before checking your BP (a full bladder can raise your pressure) Relax in a comfortable upright chair Feet on the ground Left arm resting comfortably on a flat surface at the level of your heart Legs uncrossed Back supported Sit quietly and don't talk Place the cuff on your bare arm Adjust snuggly, so that only two fingertips  can fit between your skin and the top of the cuff Check 2 readings separated by at least one minute Keep a log of your BP readings For a visual, please reference this diagram: http://ccnc.care/bpdiagram  Provider Name: [facility]     Phone: [phone]  Zone 1: ALL CLEAR  Continue to monitor your symptoms:  BP reading is less than 140 (top number) or less than 90 (bottom number)  No right  upper stomach pain No headaches or seeing spots No feeling nauseated or throwing up No swelling in face and hands  Zone 2: CAUTION Call your doctor's office for any of the following:  BP reading is greater than 140 (top number) or greater than 90 (bottom number)  Stomach pain under your ribs in the middle or right side Headaches or seeing spots Feeling nauseated or throwing up Swelling in face and hands  Zone 3: EMERGENCY  Seek immediate medical care if you have any of the following:  BP reading is greater than160 (top number) or greater than 110 (bottom number) Severe headaches not improving with Tylenol  Serious difficulty catching your breath Any worsening symptoms from Zone 2   Braxton Hicks Contractions Contractions of the uterus can occur throughout pregnancy, but they are not always a sign that you are in labor. You may have practice contractions called Braxton Hicks contractions. These false labor contractions are sometimes confused with true labor. What are Glover Larve contractions? Braxton Hicks contractions are tightening movements that occur in the muscles of the uterus before labor. Unlike true labor contractions, these contractions do not result in opening (dilation) and thinning of the cervix. Toward the end of pregnancy (32-34 weeks), Braxton Hicks contractions can happen more often and may become stronger. These contractions are sometimes difficult to tell apart from true labor because they can be very uncomfortable. You should not feel embarrassed if you go to the hospital with false labor. Sometimes, the only way to tell if you are in true labor is for your health care provider to look for changes in the cervix. The health care provider will do a physical exam and may monitor your contractions. If you are not in true labor, the exam should show that your cervix is not dilating and your water has not broken. If there are no other health problems associated with your  pregnancy, it is completely safe for you to be sent home with false labor. You may continue to have Braxton Hicks contractions until you go into true labor. How to tell the difference between true labor and false labor True labor Contractions last 30-70 seconds. Contractions become very regular. Discomfort is usually felt in the top of the uterus, and it spreads to the lower abdomen and low back. Contractions do not go away with walking. Contractions usually become more intense and increase in frequency. The cervix dilates and gets thinner. False labor Contractions are usually shorter and not as strong as true labor contractions. Contractions are usually irregular. Contractions are often felt in the front of the lower abdomen and in the groin. Contractions may go away when you walk around or change positions while lying down. Contractions get weaker and are shorter-lasting as time goes on. The cervix usually does not dilate or become thin. Follow these instructions at home:  Take over-the-counter and prescription medicines only as told by your health care provider. Keep up with your usual exercises and follow other instructions from your health care provider. Eat and drink lightly if you think  you are going into labor. If Braxton Hicks contractions are making you uncomfortable: Change your position from lying down or resting to walking, or change from walking to resting. Sit and rest in a tub of warm water. Drink enough fluid to keep your urine pale yellow. Dehydration may cause these contractions. Do slow and deep breathing several times an hour. Keep all follow-up prenatal visits as told by your health care provider. This is important. Contact a health care provider if: You have a fever. You have continuous pain in your abdomen. Get help right away if: Your contractions become stronger, more regular, and closer together. You have fluid leaking or gushing from your vagina. You pass  blood-tinged mucus (bloody show). You have bleeding from your vagina. You have low back pain that you never had before. You feel your baby's head pushing down and causing pelvic pressure. Your baby is not moving inside you as much as it used to. Summary Contractions that occur before labor are called Braxton Hicks contractions, false labor, or practice contractions. Braxton Hicks contractions are usually shorter, weaker, farther apart, and less regular than true labor contractions. True labor contractions usually become progressively stronger and regular, and they become more frequent. Manage discomfort from [facility] contractions by changing position, resting in a warm bath, drinking plenty of water, or practicing deep breathing. This information is not intended to replace advice given to you by your health care provider. Make sure you discuss any questions you have with your health care provider. Document Revised: 12/04/2016 Document Reviewed: 05/07/2016 Elsevier Patient Education  2020 [employer].

## 2023-06-22 NOTE — Progress Notes (Signed)
 US : GA = 36+5 weeks Single active female fetus, cephalic, FHR = 159 bpm, anterior pl, gr1, AFI = 12.7 cm, 40%, MVP = 4 cm, EFW 81%, AC@95 % 3323g

## 2023-06-23 LAB — CERVICOVAGINAL ANCILLARY ONLY
Chlamydia: NEGATIVE
Comment: NEGATIVE
Comment: NORMAL
Neisseria Gonorrhea: NEGATIVE

## 2023-06-29 ENCOUNTER — Ambulatory Visit: Admitting: Women's Health

## 2023-06-29 ENCOUNTER — Encounter: Payer: Self-pay | Admitting: Women's Health

## 2023-06-29 VITALS — BP 107/71 | HR 88 | Wt 211.0 lb

## 2023-06-29 DIAGNOSIS — Z3483 Encounter for supervision of other normal pregnancy, third trimester: Secondary | ICD-10-CM

## 2023-06-29 DIAGNOSIS — Z3A37 37 weeks gestation of pregnancy: Secondary | ICD-10-CM | POA: Diagnosis not present

## 2023-06-29 DIAGNOSIS — Z3480 Encounter for supervision of other normal pregnancy, unspecified trimester: Secondary | ICD-10-CM

## 2023-06-29 NOTE — Patient Instructions (Signed)
 Michelle Larson, thank you for choosing our office today! We appreciate the opportunity to meet your healthcare needs. You may receive a short survey by mail, e-mail, or through [employer]. If you are happy with your care we would appreciate if you could take just a few minutes to complete the survey questions. We read all of your comments and take your feedback very seriously. Thank you again for choosing our office.  Center for [employer] Team at [facility]  [facility] at [facility] ([address] [place], [place] [address]) Entrance C, located off of [address] Free 24/7 valet parking   CLASSES: Go to [email or web address] to register for classes (childbirth, breastfeeding, waterbirth, infant CPR, daddy bootcamp, etc.)  Call the office [phone]) or go to [facility] if: You begin to have strong, frequent contractions Your water breaks.  Sometimes it is a big gush of fluid, sometimes it is just a trickle that keeps getting your panties wet or running down your legs You have vaginal bleeding.  It is normal to have a small amount of spotting if your cervix was checked.  You don't feel your baby moving like normal.  If you don't, get you something to eat and drink and lay down and focus on feeling your baby move.   If your baby is still not moving like normal, you should call the office or go to [facility].  Call the office [phone]) or go to [facility] hospital for these signs of pre-eclampsia: Severe headache that does not go away with Tylenol  Visual changes- seeing spots, double, blurred vision Pain under your right breast or upper abdomen that does not go away with Tums or heartburn medicine Nausea and/or vomiting Severe swelling in your hands, feet, and face   [place] [facility] Doctors [facility]): [address] Meg Spina, [phone]           [facility]: [address], [phone]                 [facility]): [address], [phone] (call to ask if accepting patients) [facility] Department: [address] [place], [phone]    [facility] Pediatricians/Family Doctors [facility]): [address], [phone] [facility]: [address] [place], [phone] [facility]: [address], [phone]  [facility] Doctors  [facility]): [phone] [facility]: [address], [phone]   [facility] Doctors [facility]: [address] [phone]  [facility] Doctors  [facility]: [phone], [phone]  Home Blood Pressure Monitoring for Patients   Your provider has recommended that you check your blood pressure (BP) at least once a week at home. If you do not have a blood pressure cuff at home, one will be provided for you. Contact your provider if you have not received your monitor within 1 week.   Helpful Tips for Accurate Home Blood Pressure Checks  Don't smoke, exercise, or drink caffeine 30 minutes before checking your BP Use the restroom before checking your BP (a full bladder can raise your pressure) Relax in a comfortable upright chair Feet on the ground Left arm resting comfortably on a flat surface at the level of your heart Legs uncrossed Back supported Sit quietly and don't talk Place the cuff on your bare arm Adjust snuggly, so that only two fingertips  can fit between your skin and the top of the cuff Check 2 readings separated by at least one minute Keep a log of your BP readings For a visual, please reference this diagram: http://ccnc.care/bpdiagram  Provider Name: [facility]     Phone: [phone]  Zone 1: ALL CLEAR  Continue to monitor your symptoms:  BP reading is less than 140 (top number) or less than 90 (bottom number)  No right  upper stomach pain No headaches or seeing spots No feeling nauseated or throwing up No swelling in face and hands  Zone 2: CAUTION Call your doctor's office for any of the following:  BP reading is greater than 140 (top number) or greater than 90 (bottom number)  Stomach pain under your ribs in the middle or right side Headaches or seeing spots Feeling nauseated or throwing up Swelling in face and hands  Zone 3: EMERGENCY  Seek immediate medical care if you have any of the following:  BP reading is greater than160 (top number) or greater than 110 (bottom number) Severe headaches not improving with Tylenol  Serious difficulty catching your breath Any worsening symptoms from Zone 2   Braxton Hicks Contractions Contractions of the uterus can occur throughout pregnancy, but they are not always a sign that you are in labor. You may have practice contractions called Braxton Hicks contractions. These false labor contractions are sometimes confused with true labor. What are Glover Larve contractions? Braxton Hicks contractions are tightening movements that occur in the muscles of the uterus before labor. Unlike true labor contractions, these contractions do not result in opening (dilation) and thinning of the cervix. Toward the end of pregnancy (32-34 weeks), Braxton Hicks contractions can happen more often and may become stronger. These contractions are sometimes difficult to tell apart from true labor because they can be very uncomfortable. You should not feel embarrassed if you go to the hospital with false labor. Sometimes, the only way to tell if you are in true labor is for your health care provider to look for changes in the cervix. The health care provider will do a physical exam and may monitor your contractions. If you are not in true labor, the exam should show that your cervix is not dilating and your water has not broken. If there are no other health problems associated with your  pregnancy, it is completely safe for you to be sent home with false labor. You may continue to have Braxton Hicks contractions until you go into true labor. How to tell the difference between true labor and false labor True labor Contractions last 30-70 seconds. Contractions become very regular. Discomfort is usually felt in the top of the uterus, and it spreads to the lower abdomen and low back. Contractions do not go away with walking. Contractions usually become more intense and increase in frequency. The cervix dilates and gets thinner. False labor Contractions are usually shorter and not as strong as true labor contractions. Contractions are usually irregular. Contractions are often felt in the front of the lower abdomen and in the groin. Contractions may go away when you walk around or change positions while lying down. Contractions get weaker and are shorter-lasting as time goes on. The cervix usually does not dilate or become thin. Follow these instructions at home:  Take over-the-counter and prescription medicines only as told by your health care provider. Keep up with your usual exercises and follow other instructions from your health care provider. Eat and drink lightly if you think  you are going into labor. If Braxton Hicks contractions are making you uncomfortable: Change your position from lying down or resting to walking, or change from walking to resting. Sit and rest in a tub of warm water. Drink enough fluid to keep your urine pale yellow. Dehydration may cause these contractions. Do slow and deep breathing several times an hour. Keep all follow-up prenatal visits as told by your health care provider. This is important. Contact a health care provider if: You have a fever. You have continuous pain in your abdomen. Get help right away if: Your contractions become stronger, more regular, and closer together. You have fluid leaking or gushing from your vagina. You pass  blood-tinged mucus (bloody show). You have bleeding from your vagina. You have low back pain that you never had before. You feel your baby's head pushing down and causing pelvic pressure. Your baby is not moving inside you as much as it used to. Summary Contractions that occur before labor are called Braxton Hicks contractions, false labor, or practice contractions. Braxton Hicks contractions are usually shorter, weaker, farther apart, and less regular than true labor contractions. True labor contractions usually become progressively stronger and regular, and they become more frequent. Manage discomfort from [facility] contractions by changing position, resting in a warm bath, drinking plenty of water, or practicing deep breathing. This information is not intended to replace advice given to you by your health care provider. Make sure you discuss any questions you have with your health care provider. Document Revised: 12/04/2016 Document Reviewed: 05/07/2016 Elsevier Patient Education  2020 [employer].

## 2023-06-29 NOTE — Progress Notes (Signed)
 LOW-RISK PREGNANCY VISIT Patient name: Michelle Larson MRN [id number]  Date of birth: May 30, 1995 Chief Complaint:   Routine Prenatal Visit  History of Present Illness:   Michelle Larson is a 28 y.o. [id number] female at [redacted]w[redacted]d with an Estimated Date of Delivery: 07/15/23 being seen today for ongoing management of a low-risk pregnancy.   Today she reports no complaints. Contractions: Not present.  .  Movement: Present. denies leaking of fluid.     01/05/2023    2:57 PM 10/28/2020    9:15 AM 12/07/2018    2:50 PM  Depression screen PHQ 2/9  Decreased Interest 0 0 0  Down, Depressed, Hopeless 0 0 0  PHQ - 2 Score 0 0 0  Altered sleeping 1    Tired, decreased energy 1    Change in appetite 0    Feeling bad or failure about yourself  0    Trouble concentrating 0    Moving slowly or fidgety/restless 0    Suicidal thoughts 0    PHQ-9 Score 2          01/05/2023    2:57 PM  GAD 7 : Generalized Anxiety Score  Nervous, Anxious, on Edge 0  Control/stop worrying 0  Worry too much - different things 0  Trouble relaxing 0  Restless 0  Easily annoyed or irritable 1  Afraid - awful might happen 0  Total GAD 7 Score 1      Review of Systems:   Pertinent items are noted in HPI Denies abnormal vaginal discharge w/ itching/odor/irritation, headaches, visual changes, shortness of breath, chest pain, abdominal pain, severe nausea/vomiting, or problems with urination or bowel movements unless otherwise stated above. Pertinent History Reviewed:  Reviewed past medical,surgical, social, obstetrical and family history.  Reviewed problem list, medications and allergies. Physical Assessment:   Vitals:   06/29/23 1521  BP: 107/71  Pulse: 88  Weight: 211 lb (95.7 kg)  Body mass index is 38.59 kg/m.        Physical Examination:   General appearance: Well appearing, and in no distress  Mental status: Alert, oriented to person, place, and time  Skin: Warm & dry  Cardiovascular:  Normal heart rate noted  Respiratory: Normal respiratory effort, no distress  Abdomen: Soft, gravid, nontender  Pelvic: Cervical exam deferred         Extremities: Edema: None  Fetal Status: Fetal Heart Rate (bpm): 145 Fundal Height: 36 cm Movement: Present    Chaperone: N/A No results found for this or any previous visit (from the past 24 hours).  Assessment & Plan:  1) Low-risk pregnancy [id number] at [redacted]w[redacted]d with an Estimated Date of Delivery: 07/15/23    Meds: No orders of the defined types were placed in this encounter.  Labs/procedures today: none  Plan:  Continue routine obstetrical care  Next visit: prefers in person    Reviewed: Term labor symptoms and general obstetric precautions including but not limited to vaginal bleeding, contractions, leaking of fluid and fetal movement were reviewed in detail with the patient.  All questions were answered. Does have home bp cuff. Office bp cuff given: not applicable. Check bp weekly, let us  know if consistently >140 and/or >90.  Follow-up: Return for As scheduled.  Future Appointments  Date Time Provider Department Center  07/07/2023  2:50 PM Kizzie Suzen [place], [place] [facility]  07/13/2023 11:50 AM Kizzie Suzen [facility], CNM [facility]    No orders of the defined types were placed in this encounter.  [place]  JONELLE Fetters CNM, [facility] 06/29/2023 3:46 PM

## 2023-07-07 ENCOUNTER — Ambulatory Visit: Admitting: Women's Health

## 2023-07-07 ENCOUNTER — Encounter: Payer: Self-pay | Admitting: Women's Health

## 2023-07-07 VITALS — BP 108/71 | HR 80 | Wt 215.4 lb

## 2023-07-07 DIAGNOSIS — Z3483 Encounter for supervision of other normal pregnancy, third trimester: Secondary | ICD-10-CM

## 2023-07-07 DIAGNOSIS — Z3A38 38 weeks gestation of pregnancy: Secondary | ICD-10-CM

## 2023-07-07 DIAGNOSIS — Z3480 Encounter for supervision of other normal pregnancy, unspecified trimester: Secondary | ICD-10-CM

## 2023-07-07 NOTE — Patient Instructions (Signed)
 Michelle Larson, thank you for choosing our office today! We appreciate the opportunity to meet your healthcare needs. You may receive a short survey by mail, e-mail, or through [employer]. If you are happy with your care we would appreciate if you could take just a few minutes to complete the survey questions. We read all of your comments and take your feedback very seriously. Thank you again for choosing our office.  Center for [employer] Team at [facility]  [facility] at [facility] ([address] [place], [place] [address]) Entrance C, located off of [address] Free 24/7 valet parking   CLASSES: Go to [email or web address] to register for classes (childbirth, breastfeeding, waterbirth, infant CPR, daddy bootcamp, etc.)  Call the office [phone]) or go to [facility] if: You begin to have strong, frequent contractions Your water breaks.  Sometimes it is a big gush of fluid, sometimes it is just a trickle that keeps getting your panties wet or running down your legs You have vaginal bleeding.  It is normal to have a small amount of spotting if your cervix was checked.  You don't feel your baby moving like normal.  If you don't, get you something to eat and drink and lay down and focus on feeling your baby move.   If your baby is still not moving like normal, you should call the office or go to [facility].  Call the office [phone]) or go to [facility] hospital for these signs of pre-eclampsia: Severe headache that does not go away with Tylenol  Visual changes- seeing spots, double, blurred vision Pain under your right breast or upper abdomen that does not go away with Tums or heartburn medicine Nausea and/or vomiting Severe swelling in your hands, feet, and face   [place] [facility] Doctors [facility]): [address] Meg Spina, [phone]           [facility]: [address], [phone]                 [facility]): [address], [phone] (call to ask if accepting patients) [facility] Department: [address] [place], [phone]    [facility] Pediatricians/Family Doctors [facility]): [address], [phone] [facility]: [address] [place], [phone] [facility]: [address], [phone]  [facility] Doctors  [facility]): [phone] [facility]: [address], [phone]   [facility] Doctors [facility]: [address] [phone]  [facility] Doctors  [facility]: [phone], [phone]  Home Blood Pressure Monitoring for Patients   Your provider has recommended that you check your blood pressure (BP) at least once a week at home. If you do not have a blood pressure cuff at home, one will be provided for you. Contact your provider if you have not received your monitor within 1 week.   Helpful Tips for Accurate Home Blood Pressure Checks  Don't smoke, exercise, or drink caffeine 30 minutes before checking your BP Use the restroom before checking your BP (a full bladder can raise your pressure) Relax in a comfortable upright chair Feet on the ground Left arm resting comfortably on a flat surface at the level of your heart Legs uncrossed Back supported Sit quietly and don't talk Place the cuff on your bare arm Adjust snuggly, so that only two fingertips  can fit between your skin and the top of the cuff Check 2 readings separated by at least one minute Keep a log of your BP readings For a visual, please reference this diagram: http://ccnc.care/bpdiagram  Provider Name: [facility]     Phone: [phone]  Zone 1: ALL CLEAR  Continue to monitor your symptoms:  BP reading is less than 140 (top number) or less than 90 (bottom number)  No right  upper stomach pain No headaches or seeing spots No feeling nauseated or throwing up No swelling in face and hands  Zone 2: CAUTION Call your doctor's office for any of the following:  BP reading is greater than 140 (top number) or greater than 90 (bottom number)  Stomach pain under your ribs in the middle or right side Headaches or seeing spots Feeling nauseated or throwing up Swelling in face and hands  Zone 3: EMERGENCY  Seek immediate medical care if you have any of the following:  BP reading is greater than160 (top number) or greater than 110 (bottom number) Severe headaches not improving with Tylenol  Serious difficulty catching your breath Any worsening symptoms from Zone 2   Braxton Hicks Contractions Contractions of the uterus can occur throughout pregnancy, but they are not always a sign that you are in labor. You may have practice contractions called Braxton Hicks contractions. These false labor contractions are sometimes confused with true labor. What are Glover Larve contractions? Braxton Hicks contractions are tightening movements that occur in the muscles of the uterus before labor. Unlike true labor contractions, these contractions do not result in opening (dilation) and thinning of the cervix. Toward the end of pregnancy (32-34 weeks), Braxton Hicks contractions can happen more often and may become stronger. These contractions are sometimes difficult to tell apart from true labor because they can be very uncomfortable. You should not feel embarrassed if you go to the hospital with false labor. Sometimes, the only way to tell if you are in true labor is for your health care provider to look for changes in the cervix. The health care provider will do a physical exam and may monitor your contractions. If you are not in true labor, the exam should show that your cervix is not dilating and your water has not broken. If there are no other health problems associated with your  pregnancy, it is completely safe for you to be sent home with false labor. You may continue to have Braxton Hicks contractions until you go into true labor. How to tell the difference between true labor and false labor True labor Contractions last 30-70 seconds. Contractions become very regular. Discomfort is usually felt in the top of the uterus, and it spreads to the lower abdomen and low back. Contractions do not go away with walking. Contractions usually become more intense and increase in frequency. The cervix dilates and gets thinner. False labor Contractions are usually shorter and not as strong as true labor contractions. Contractions are usually irregular. Contractions are often felt in the front of the lower abdomen and in the groin. Contractions may go away when you walk around or change positions while lying down. Contractions get weaker and are shorter-lasting as time goes on. The cervix usually does not dilate or become thin. Follow these instructions at home:  Take over-the-counter and prescription medicines only as told by your health care provider. Keep up with your usual exercises and follow other instructions from your health care provider. Eat and drink lightly if you think  you are going into labor. If Braxton Hicks contractions are making you uncomfortable: Change your position from lying down or resting to walking, or change from walking to resting. Sit and rest in a tub of warm water. Drink enough fluid to keep your urine pale yellow. Dehydration may cause these contractions. Do slow and deep breathing several times an hour. Keep all follow-up prenatal visits as told by your health care provider. This is important. Contact a health care provider if: You have a fever. You have continuous pain in your abdomen. Get help right away if: Your contractions become stronger, more regular, and closer together. You have fluid leaking or gushing from your vagina. You pass  blood-tinged mucus (bloody show). You have bleeding from your vagina. You have low back pain that you never had before. You feel your baby's head pushing down and causing pelvic pressure. Your baby is not moving inside you as much as it used to. Summary Contractions that occur before labor are called Braxton Hicks contractions, false labor, or practice contractions. Braxton Hicks contractions are usually shorter, weaker, farther apart, and less regular than true labor contractions. True labor contractions usually become progressively stronger and regular, and they become more frequent. Manage discomfort from [facility] contractions by changing position, resting in a warm bath, drinking plenty of water, or practicing deep breathing. This information is not intended to replace advice given to you by your health care provider. Make sure you discuss any questions you have with your health care provider. Document Revised: 12/04/2016 Document Reviewed: 05/07/2016 Elsevier Patient Education  2020 [employer].

## 2023-07-07 NOTE — Progress Notes (Signed)
 LOW-RISK PREGNANCY VISIT Patient name: Michelle Larson MRN [id number]  Date of birth: 05-12-1995 Chief Complaint:   Routine Prenatal Visit (Cervical check)  History of Present Illness:   Michelle Larson is a 28 y.o. [phone] female at [redacted]w[redacted]d with an Estimated Date of Delivery: 07/15/23 being seen today for ongoing management of a low-risk pregnancy.   Today she reports no complaints. Contractions: Not present.  .  Movement: Present. denies leaking of fluid.     01/05/2023    2:57 PM 10/28/2020    9:15 AM 12/07/2018    2:50 PM  Depression screen PHQ 2/9  Decreased Interest 0 0 0  Down, Depressed, Hopeless 0 0 0  PHQ - 2 Score 0 0 0  Altered sleeping 1    Tired, decreased energy 1    Change in appetite 0    Feeling bad or failure about yourself  0    Trouble concentrating 0    Moving slowly or fidgety/restless 0    Suicidal thoughts 0    PHQ-9 Score 2          01/05/2023    2:57 PM  GAD 7 : Generalized Anxiety Score  Nervous, Anxious, on Edge 0  Control/stop worrying 0  Worry too much - different things 0  Trouble relaxing 0  Restless 0  Easily annoyed or irritable 1  Afraid - awful might happen 0  Total GAD 7 Score 1      Review of Systems:   Pertinent items are noted in HPI Denies abnormal vaginal discharge w/ itching/odor/irritation, headaches, visual changes, shortness of breath, chest pain, abdominal pain, severe nausea/vomiting, or problems with urination or bowel movements unless otherwise stated above. Pertinent History Reviewed:  Reviewed past medical,surgical, social, obstetrical and family history.  Reviewed problem list, medications and allergies. Physical Assessment:   Vitals:   07/07/23 1457  BP: 108/71  Pulse: 80  Weight: 215 lb 6.4 oz (97.7 kg)  Body mass index is 39.4 kg/m.        Physical Examination:   General appearance: Well appearing, and in no distress  Mental status: Alert, oriented to person, place, and time  Skin: Warm &  dry  Cardiovascular: Normal heart rate noted  Respiratory: Normal respiratory effort, no distress  Abdomen: Soft, gravid, nontender  Pelvic: Cervical exam performed  Dilation: 4.5 Effacement (%): 50 Station: -3  Extremities: Edema: Trace  Fetal Status: Fetal Heart Rate (bpm): 152 Fundal Height: 37 cm Movement: Present Presentation: Vertex  Chaperone: Peggy Dones No results found for this or any previous visit (from the past 24 hours).  Assessment & Plan:  1) Low-risk pregnancy [id number] at [redacted]w[redacted]d with an Estimated Date of Delivery: 07/15/23   2) H/O LGA, EFW 81% at 36w   Meds: No orders of the defined types were placed in this encounter.  Labs/procedures today: SVE  Plan:  Continue routine obstetrical care  Next visit: prefers in person    Reviewed: Term labor symptoms and general obstetric precautions including but not limited to vaginal bleeding, contractions, leaking of fluid and fetal movement were reviewed in detail with the patient.  All questions were answered. Does have home bp cuff. Office bp cuff given: not applicable. Check bp weekly, let us  know if consistently >140 and/or >90.  Follow-up: Return for As scheduled.  Future Appointments  Date Time Provider Department Center  07/13/2023 11:50 AM Kizzie Suzen SAUNDERS, CNM [facility]    No orders of the defined types were placed in this  encounter.  Suzen JONELLE Fetters CNM, [facility] 07/07/2023 3:26 PM

## 2023-07-13 ENCOUNTER — Encounter: Payer: Self-pay | Admitting: Women's Health

## 2023-07-13 ENCOUNTER — Ambulatory Visit: Admitting: Women's Health

## 2023-07-13 VITALS — BP 105/71 | HR 92 | Wt 214.8 lb

## 2023-07-13 DIAGNOSIS — Z3480 Encounter for supervision of other normal pregnancy, unspecified trimester: Secondary | ICD-10-CM

## 2023-07-13 DIAGNOSIS — Z3483 Encounter for supervision of other normal pregnancy, third trimester: Secondary | ICD-10-CM | POA: Diagnosis not present

## 2023-07-13 DIAGNOSIS — Z3A39 39 weeks gestation of pregnancy: Secondary | ICD-10-CM | POA: Diagnosis not present

## 2023-07-13 NOTE — Patient Instructions (Signed)
 Michelle Larson, thank you for choosing our office today! We appreciate the opportunity to meet your healthcare needs. You may receive a short survey by mail, e-mail, or through [employer]. If you are happy with your care we would appreciate if you could take just a few minutes to complete the survey questions. We read all of your comments and take your feedback very seriously. Thank you again for choosing our office.  Center for [employer] Team at [facility]  [facility] at [facility] ([address] [place], [place] [address]) Entrance C, located off of [address] Free 24/7 valet parking   CLASSES: Go to [email or web address] to register for classes (childbirth, breastfeeding, waterbirth, infant CPR, daddy bootcamp, etc.)  Call the office [phone]) or go to [facility] if: You begin to have strong, frequent contractions Your water breaks.  Sometimes it is a big gush of fluid, sometimes it is just a trickle that keeps getting your panties wet or running down your legs You have vaginal bleeding.  It is normal to have a small amount of spotting if your cervix was checked.  You don't feel your baby moving like normal.  If you don't, get you something to eat and drink and lay down and focus on feeling your baby move.   If your baby is still not moving like normal, you should call the office or go to [facility].  Call the office [phone]) or go to [facility] hospital for these signs of pre-eclampsia: Severe headache that does not go away with Tylenol  Visual changes- seeing spots, double, blurred vision Pain under your right breast or upper abdomen that does not go away with Tums or heartburn medicine Nausea and/or vomiting Severe swelling in your hands, feet, and face   [place] [facility] Doctors [facility]): [address] Meg Spina, [phone]           [facility]: [address], [phone]                 [facility]): [address], [phone] (call to ask if accepting patients) [facility] Department: [address] [place], [phone]    [facility] Pediatricians/Family Doctors [facility]): [address], [phone] [facility]: [address] [place], [phone] [facility]: [address], [phone]  [facility] Doctors  [facility]): [phone] [facility]: [address], [phone]   [facility] Doctors [facility]: [address] [phone]  [facility] Doctors  [facility]: [phone], [phone]  Home Blood Pressure Monitoring for Patients   Your provider has recommended that you check your blood pressure (BP) at least once a week at home. If you do not have a blood pressure cuff at home, one will be provided for you. Contact your provider if you have not received your monitor within 1 week.   Helpful Tips for Accurate Home Blood Pressure Checks  Don't smoke, exercise, or drink caffeine 30 minutes before checking your BP Use the restroom before checking your BP (a full bladder can raise your pressure) Relax in a comfortable upright chair Feet on the ground Left arm resting comfortably on a flat surface at the level of your heart Legs uncrossed Back supported Sit quietly and don't talk Place the cuff on your bare arm Adjust snuggly, so that only two fingertips  can fit between your skin and the top of the cuff Check 2 readings separated by at least one minute Keep a log of your BP readings For a visual, please reference this diagram: http://ccnc.care/bpdiagram  Provider Name: [facility]     Phone: [phone]  Zone 1: ALL CLEAR  Continue to monitor your symptoms:  BP reading is less than 140 (top number) or less than 90 (bottom number)  No right  upper stomach pain No headaches or seeing spots No feeling nauseated or throwing up No swelling in face and hands  Zone 2: CAUTION Call your doctor's office for any of the following:  BP reading is greater than 140 (top number) or greater than 90 (bottom number)  Stomach pain under your ribs in the middle or right side Headaches or seeing spots Feeling nauseated or throwing up Swelling in face and hands  Zone 3: EMERGENCY  Seek immediate medical care if you have any of the following:  BP reading is greater than160 (top number) or greater than 110 (bottom number) Severe headaches not improving with Tylenol  Serious difficulty catching your breath Any worsening symptoms from Zone 2   Braxton Hicks Contractions Contractions of the uterus can occur throughout pregnancy, but they are not always a sign that you are in labor. You may have practice contractions called Braxton Hicks contractions. These false labor contractions are sometimes confused with true labor. What are Glover Larve contractions? Braxton Hicks contractions are tightening movements that occur in the muscles of the uterus before labor. Unlike true labor contractions, these contractions do not result in opening (dilation) and thinning of the cervix. Toward the end of pregnancy (32-34 weeks), Braxton Hicks contractions can happen more often and may become stronger. These contractions are sometimes difficult to tell apart from true labor because they can be very uncomfortable. You should not feel embarrassed if you go to the hospital with false labor. Sometimes, the only way to tell if you are in true labor is for your health care provider to look for changes in the cervix. The health care provider will do a physical exam and may monitor your contractions. If you are not in true labor, the exam should show that your cervix is not dilating and your water has not broken. If there are no other health problems associated with your  pregnancy, it is completely safe for you to be sent home with false labor. You may continue to have Braxton Hicks contractions until you go into true labor. How to tell the difference between true labor and false labor True labor Contractions last 30-70 seconds. Contractions become very regular. Discomfort is usually felt in the top of the uterus, and it spreads to the lower abdomen and low back. Contractions do not go away with walking. Contractions usually become more intense and increase in frequency. The cervix dilates and gets thinner. False labor Contractions are usually shorter and not as strong as true labor contractions. Contractions are usually irregular. Contractions are often felt in the front of the lower abdomen and in the groin. Contractions may go away when you walk around or change positions while lying down. Contractions get weaker and are shorter-lasting as time goes on. The cervix usually does not dilate or become thin. Follow these instructions at home:  Take over-the-counter and prescription medicines only as told by your health care provider. Keep up with your usual exercises and follow other instructions from your health care provider. Eat and drink lightly if you think  you are going into labor. If Braxton Hicks contractions are making you uncomfortable: Change your position from lying down or resting to walking, or change from walking to resting. Sit and rest in a tub of warm water. Drink enough fluid to keep your urine pale yellow. Dehydration may cause these contractions. Do slow and deep breathing several times an hour. Keep all follow-up prenatal visits as told by your health care provider. This is important. Contact a health care provider if: You have a fever. You have continuous pain in your abdomen. Get help right away if: Your contractions become stronger, more regular, and closer together. You have fluid leaking or gushing from your vagina. You pass  blood-tinged mucus (bloody show). You have bleeding from your vagina. You have low back pain that you never had before. You feel your baby's head pushing down and causing pelvic pressure. Your baby is not moving inside you as much as it used to. Summary Contractions that occur before labor are called Braxton Hicks contractions, false labor, or practice contractions. Braxton Hicks contractions are usually shorter, weaker, farther apart, and less regular than true labor contractions. True labor contractions usually become progressively stronger and regular, and they become more frequent. Manage discomfort from [facility] contractions by changing position, resting in a warm bath, drinking plenty of water, or practicing deep breathing. This information is not intended to replace advice given to you by your health care provider. Make sure you discuss any questions you have with your health care provider. Document Revised: 12/04/2016 Document Reviewed: 05/07/2016 Elsevier Patient Education  2020 [employer].

## 2023-07-13 NOTE — Progress Notes (Signed)
 LOW-RISK PREGNANCY VISIT Patient name: Michelle Larson MRN [id number]  Date of birth: May 14, 1995 Chief Complaint:   Routine Prenatal Visit (Cervical check)  History of Present Illness:   Michelle Larson is a 28 y.o. [phone] female at [redacted]w[redacted]d with an Estimated Date of Delivery: 07/15/23 being seen today for ongoing management of a low-risk pregnancy.   Today she reports occasional contractions. Contractions: Not present.  .  Movement: Present. denies leaking of fluid.     01/05/2023    2:57 PM 10/28/2020    9:15 AM 12/07/2018    2:50 PM  Depression screen PHQ 2/9  Decreased Interest 0 0 0  Down, Depressed, Hopeless 0 0 0  PHQ - 2 Score 0 0 0  Altered sleeping 1    Tired, decreased energy 1    Change in appetite 0    Feeling bad or failure about yourself  0    Trouble concentrating 0    Moving slowly or fidgety/restless 0    Suicidal thoughts 0    PHQ-9 Score 2          01/05/2023    2:57 PM  GAD 7 : Generalized Anxiety Score  Nervous, Anxious, on Edge 0  Control/stop worrying 0  Worry too much - different things 0  Trouble relaxing 0  Restless 0  Easily annoyed or irritable 1  Afraid - awful might happen 0  Total GAD 7 Score 1      Review of Systems:   Pertinent items are noted in HPI Denies abnormal vaginal discharge w/ itching/odor/irritation, headaches, visual changes, shortness of breath, chest pain, abdominal pain, severe nausea/vomiting, or problems with urination or bowel movements unless otherwise stated above. Pertinent History Reviewed:  Reviewed past medical,surgical, social, obstetrical and family history.  Reviewed problem list, medications and allergies. Physical Assessment:   Vitals:   07/13/23 1157  BP: 105/71  Pulse: 92  Weight: 214 lb 12.8 oz (97.4 kg)  Body mass index is 39.29 kg/m.        Physical Examination:   General appearance: Well appearing, and in no distress  Mental status: Alert, oriented to person, place, and  time  Skin: Warm & dry  Cardiovascular: Normal heart rate noted  Respiratory: Normal respiratory effort, no distress  Abdomen: Soft, gravid, nontender  Pelvic: Cervical exam performed  Dilation: 5 Effacement (%): 50 Station: -3, Offered membrane sweeping, discussed r/b- pt decided to proceed, so membranes swept.   Extremities: Edema: Trace  Fetal Status: Fetal Heart Rate (bpm): 152 Fundal Height: 38 cm Movement: Present Presentation: Vertex  Chaperone: Alan Fischer No results found for this or any previous visit (from the past 24 hours).  Assessment & Plan:  1) Low-risk pregnancy [id number] at [redacted]w[redacted]d with an Estimated Date of Delivery: 07/15/23   2) H/O LGA, EFW 81% at 36w   3) H/O PPH w/ 3rd- plan TXA   Meds: No orders of the defined types were placed in this encounter.  Labs/procedures today: SVE and membrane sweep, doesn't want IOL if possible  Plan:  Continue routine obstetrical care  Next visit: prefers will be in person for postdates NST    Reviewed: Term labor symptoms and general obstetric precautions including but not limited to vaginal bleeding, contractions, leaking of fluid and fetal movement were reviewed in detail with the patient.  All questions were answered. Does have home bp cuff. Office bp cuff given: not applicable. Check bp daily, let us  know if consistently >140 and/or >90.  Follow-up: Return  for Mon NST/LROB.  Future Appointments  Date Time Provider Department Center  07/19/2023  8:50 AM [facility] NURSE [facility]  07/19/2023  9:10 AM Jayne Vonn DEL, MD [facility]    No orders of the defined types were placed in this encounter.  Suzen JONELLE Fetters CNM, [facility] 07/13/2023 12:29 PM

## 2023-07-14 ENCOUNTER — Inpatient Hospital Stay (HOSPITAL_COMMUNITY)
Admission: AD | Admit: 2023-07-14 | Discharge: 2023-07-15 | DRG: 807 | Disposition: A | Discharge status: Home / Self Care | Attending: Family Medicine | Admitting: Family Medicine

## 2023-07-14 ENCOUNTER — Encounter (HOSPITAL_COMMUNITY): Payer: Self-pay | Admitting: Obstetrics and Gynecology

## 2023-07-14 ENCOUNTER — Other Ambulatory Visit: Payer: Self-pay

## 2023-07-14 DIAGNOSIS — Z833 Family history of diabetes mellitus: Secondary | ICD-10-CM

## 2023-07-14 DIAGNOSIS — Z3A39 39 weeks gestation of pregnancy: Secondary | ICD-10-CM

## 2023-07-14 DIAGNOSIS — O26893 Other specified pregnancy related conditions, third trimester: Secondary | ICD-10-CM | POA: Diagnosis not present

## 2023-07-14 DIAGNOSIS — B951 Streptococcus, group B, as the cause of diseases classified elsewhere: Secondary | ICD-10-CM | POA: Diagnosis present

## 2023-07-14 DIAGNOSIS — O479 False labor, unspecified: Principal | ICD-10-CM | POA: Diagnosis present

## 2023-07-14 DIAGNOSIS — O99820 Streptococcus B carrier state complicating pregnancy: Secondary | ICD-10-CM | POA: Diagnosis not present

## 2023-07-14 DIAGNOSIS — O99824 Streptococcus B carrier state complicating childbirth: Principal | ICD-10-CM | POA: Diagnosis present

## 2023-07-14 DIAGNOSIS — Z8759 Personal history of other complications of pregnancy, childbirth and the puerperium: Secondary | ICD-10-CM

## 2023-07-14 LAB — CBC
HCT: 33.1 % — ABNORMAL LOW (ref 36.0–46.0)
Hemoglobin: 10.9 g/dL — ABNORMAL LOW (ref 12.0–15.0)
MCH: 27.9 pg (ref 26.0–34.0)
MCHC: 32.9 g/dL (ref 30.0–36.0)
MCV: 84.7 fL (ref 80.0–100.0)
Platelets: 258 K/uL (ref 150–400)
RBC: 3.91 MIL/uL (ref 3.87–5.11)
RDW: 15.2 % (ref 11.5–15.5)
WBC: 10.5 K/uL (ref 4.0–10.5)
nRBC: 0.2 % (ref 0.0–0.2)

## 2023-07-14 LAB — HIV ANTIBODY (ROUTINE TESTING W REFLEX): HIV Screen 4th Generation wRfx: NONREACTIVE

## 2023-07-14 LAB — TYPE AND SCREEN
ABO/RH(D): O POS
Antibody Screen: NEGATIVE

## 2023-07-14 LAB — RPR: RPR Ser Ql: NONREACTIVE

## 2023-07-14 MED ORDER — SODIUM CHLORIDE 0.9 % IV SOLN
2.0000 g | Freq: Once | INTRAVENOUS | Status: AC
  Administered 2023-07-14: 2 g via INTRAVENOUS
  Filled 2023-07-14: qty 2000

## 2023-07-14 MED ORDER — SIMETHICONE 80 MG PO CHEW: 80.0000 mg | CHEWABLE_TABLET | ORAL | Status: DC | PRN

## 2023-07-14 MED ORDER — LIDOCAINE HCL (PF) 1 % IJ SOLN: 30.0000 mL | INTRAMUSCULAR | Status: DC | PRN

## 2023-07-14 MED ORDER — MISOPROSTOL 200 MCG PO TABS
ORAL_TABLET | ORAL | Status: AC
  Filled 2023-07-14: qty 4

## 2023-07-14 MED ORDER — SOD CITRATE-CITRIC ACID 500-334 MG/5ML PO SOLN: 30.0000 mL | ORAL | Status: DC | PRN

## 2023-07-14 MED ORDER — DIBUCAINE (PERIANAL) 1 % EX OINT: 1.0000 | TOPICAL_OINTMENT | CUTANEOUS | Status: DC | PRN

## 2023-07-14 MED ORDER — FLEET ENEMA RE ENEM: 1.0000 | ENEMA | Freq: Every day | RECTAL | Status: DC | PRN

## 2023-07-14 MED ORDER — PRENATAL MULTIVITAMIN CH
1.0000 | ORAL_TABLET | Freq: Every day | ORAL | Status: DC
  Administered 2023-07-14: 1 via ORAL
  Filled 2023-07-14 (×2): qty 1

## 2023-07-14 MED ORDER — ONDANSETRON HCL 4 MG/2ML IJ SOLN: 4.0000 mg | INTRAMUSCULAR | Status: DC | PRN

## 2023-07-14 MED ORDER — SODIUM CHLORIDE 0.9 % IV SOLN
1.0000 g | INTRAVENOUS | Status: DC
  Administered 2023-07-14: 1 g via INTRAVENOUS
  Filled 2023-07-14: qty 1000

## 2023-07-14 MED ORDER — LACTATED RINGERS IV SOLN: 500.0000 mL | INTRAVENOUS | Status: DC | PRN

## 2023-07-14 MED ORDER — COCONUT OIL OIL: 1.0000 | TOPICAL_OIL | Status: DC | PRN

## 2023-07-14 MED ORDER — OXYTOCIN-SODIUM CHLORIDE 30-0.9 UT/500ML-% IV SOLN
2.5000 [IU]/h | INTRAVENOUS | Status: DC
  Filled 2023-07-14: qty 500

## 2023-07-14 MED ORDER — BENZOCAINE-MENTHOL 20-0.5 % EX AERO: 1.0000 | INHALATION_SPRAY | CUTANEOUS | Status: DC | PRN

## 2023-07-14 MED ORDER — IBUPROFEN 600 MG PO TABS
600.0000 mg | ORAL_TABLET | Freq: Four times a day (QID) | ORAL | Status: DC
  Administered 2023-07-14 – 2023-07-15 (×4): 600 mg via ORAL
  Filled 2023-07-14 (×5): qty 1

## 2023-07-14 MED ORDER — BISACODYL 10 MG RE SUPP: 10.0000 mg | Freq: Every day | RECTAL | Status: DC | PRN

## 2023-07-14 MED ORDER — ACETAMINOPHEN 325 MG PO TABS: 650.0000 mg | ORAL_TABLET | ORAL | Status: DC | PRN

## 2023-07-14 MED ORDER — DIPHENHYDRAMINE HCL 25 MG PO CAPS: 25.0000 mg | ORAL_CAPSULE | Freq: Four times a day (QID) | ORAL | Status: DC | PRN

## 2023-07-14 MED ORDER — TETANUS-DIPHTH-ACELL PERTUSSIS 5-2.5-18.5 LF-MCG/0.5 IM SUSY
0.5000 mL | PREFILLED_SYRINGE | Freq: Once | INTRAMUSCULAR | Status: DC
  Filled 2023-07-14: qty 0.5

## 2023-07-14 MED ORDER — OXYCODONE-ACETAMINOPHEN 5-325 MG PO TABS: 2.0000 | ORAL_TABLET | ORAL | Status: DC | PRN

## 2023-07-14 MED ORDER — OXYTOCIN BOLUS FROM INFUSION
333.0000 mL | Freq: Once | INTRAVENOUS | Status: AC
  Administered 2023-07-14: 333 mL via INTRAVENOUS

## 2023-07-14 MED ORDER — ONDANSETRON HCL 4 MG PO TABS: 4.0000 mg | ORAL_TABLET | ORAL | Status: DC | PRN

## 2023-07-14 MED ORDER — ONDANSETRON HCL 4 MG/2ML IJ SOLN: 4.0000 mg | Freq: Four times a day (QID) | INTRAMUSCULAR | Status: DC | PRN

## 2023-07-14 MED ORDER — ZOLPIDEM TARTRATE 5 MG PO TABS: 5.0000 mg | ORAL_TABLET | Freq: Every evening | ORAL | Status: DC | PRN

## 2023-07-14 MED ORDER — WITCH HAZEL-GLYCERIN EX PADS: 1.0000 | MEDICATED_PAD | CUTANEOUS | Status: DC | PRN

## 2023-07-14 MED ORDER — SENNOSIDES-DOCUSATE SODIUM 8.6-50 MG PO TABS
2.0000 | ORAL_TABLET | Freq: Every day | ORAL | Status: DC
  Filled 2023-07-14: qty 2

## 2023-07-14 MED ORDER — LACTATED RINGERS IV SOLN: INTRAVENOUS | Status: DC

## 2023-07-14 MED ORDER — MISOPROSTOL 200 MCG PO TABS
800.0000 ug | ORAL_TABLET | Freq: Once | ORAL | Status: AC
  Administered 2023-07-14: 800 ug via BUCCAL

## 2023-07-14 MED ORDER — OXYCODONE-ACETAMINOPHEN 5-325 MG PO TABS: 1.0000 | ORAL_TABLET | ORAL | Status: DC | PRN

## 2023-07-14 NOTE — Progress Notes (Signed)
 Patient ID: Michelle Larson, female   DOB: 1995/03/10, 28 y.o.   MRN: [id number] DOing well with nitrous Vitals:   07/14/23 0128 07/14/23 0247 07/14/23 0425 07/14/23 0540  BP: 131/71 139/64 124/67 125/69  Pulse: 92 99 (!) 101 (!) 104  Resp: 16 16  18   Temp: 97.8 F (36.6 C) 98.7 F (37.1 C)    TempSrc: Oral Oral    SpO2: 99%     Weight: 97.1 kg     Height: 5' 2 (1.575 m)      Got first dose of Ampicillin   Plan AROM around 6:30 or 7am

## 2023-07-14 NOTE — Discharge Summary (Signed)
 Postpartum Discharge Summary  Date of Service updated***     Patient Name: Michelle Larson DOB: 05-28-1995 MRN: [id number]  Date of admission: 07/14/2023 Delivery date:07/14/2023 Delivering provider: TRUDY EARNIE CROME Date of discharge: 07/14/2023  Admitting diagnosis: Uterine contractions during pregnancy [O47.9] Intrauterine pregnancy: [redacted]w[redacted]d     Secondary diagnosis:  Principal Problem:   Uterine contractions during pregnancy Active Problems:   Vaginal delivery  Additional problems:     Discharge diagnosis: Term Pregnancy Delivered                                              Post partum procedures:{Postpartum procedures:23558} Augmentation: AROM Complications: {OB Labor/Delivery Complications:20784}  Hospital course: Onset of Labor With Vaginal Delivery      28 y.o. yo [id number] at [redacted]w[redacted]d was admitted in Active Labor on 07/14/2023. Labor course was complicated bynone  Membrane Rupture Time/Date: 6:47 AM,07/14/2023  Delivery Method:Vaginal, Spontaneous Operative Delivery:N/A Episiotomy: None Lacerations:  None Patient had a postpartum course complicated by ***.  She is ambulating, tolerating a regular diet, passing flatus, and urinating well. Patient is discharged home in stable condition on 07/14/23.  Newborn Data: Birth date:07/14/2023 Birth time:7:45 AM Gender:Female Living status:Living Apgars:9 ,9  Weight:   Magnesium Sulfate received: {Mag received:30440022} BMZ received: {BMZ received:30440023} Rhophylac:{Rhophylac received:30440032} FFM:{FFM:69559966} T-DaP:{Tdap:23962} Flu: {Qol:76036} RSV Vaccine received: {RSV:31013} Transfusion:{Transfusion received:30440034} Immunizations administered: Immunization History  Administered Date(s) Administered   Influenza, Seasonal, Injecte, Preservative Fre 01/05/2023   Influenza,inj,Quad PF,6+ Mos 12/07/2018   Tdap 11/06/2015, 03/03/2019, 04/22/2023    Physical exam  Vitals:   07/14/23 0247 07/14/23 0425 07/14/23  0540 07/14/23 0652  BP: 139/64 124/67 125/69 139/62  Pulse: 99 (!) 101 (!) 104 (!) 104  Resp: 16  18 16   Temp: 98.7 F (37.1 C)     TempSrc: Oral     SpO2:      Weight:      Height:       General: {Exam; general:21111117} Lochia: {Desc; appropriate/inappropriate:30686::appropriate} Uterine Fundus: {Desc; firm/soft:30687} Incision: {Exam; incision:21111123} DVT Evaluation: {Exam; dvt:2111122} Labs: Lab Results  Component Value Date   WBC 10.5 07/14/2023   HGB 10.9 (L) 07/14/2023   HCT 33.1 (L) 07/14/2023   MCV 84.7 07/14/2023   PLT 258 07/14/2023      Latest Ref Rng & Units 02/26/2021    2:56 AM  CMP  Glucose 70 - 99 mg/dL 897   BUN 6 - 20 mg/dL 12   Creatinine 9.55 - 1.00 mg/dL 9.50   Sodium 864 - 854 mmol/L 138   Potassium 3.5 - 5.1 mmol/L 3.6   Chloride 98 - 111 mmol/L 109   CO2 22 - 32 mmol/L 22   Calcium 8.9 - 10.3 mg/dL 8.6    Edinburgh Score:    07/06/2019   11:57 AM  Edinburgh Postnatal Depression Scale Screening Tool  I have been able to laugh and see the funny side of things. 0  I have looked forward with enjoyment to things. 0  I have blamed myself unnecessarily when things went wrong. 0  I have been anxious or worried for no good reason. 0  I have felt scared or panicky for no good reason. 0  Things have been getting on top of me. 0  I have been so unhappy that I have had difficulty sleeping. 0  I have felt sad or miserable. 0  I have been so unhappy that I have been crying. 0  The thought of harming myself has occurred to me. 0  Edinburgh Postnatal Depression Scale Total 0      Data saved with a previous flowsheet row definition      After visit meds:  Allergies as of 07/14/2023   No Known Allergies   Med Rec must be completed prior to using this [facility]***        Discharge home in stable condition Infant Feeding: {Baby feeding:23562} Infant Disposition:{CHL IP OB HOME WITH FNUYZM:76418} Discharge instruction: per After Visit  Summary and Postpartum booklet. Activity: Advance as tolerated. Pelvic rest for 6 weeks.  Diet: {OB diet:21111121} Anticipated Birth Control: {Birth Control:23956} Postpartum Appointment:{Outpatient follow up:23559} Additional Postpartum F/U: {PP Procedure:23957} Future Appointments: Future Appointments  Date Time Provider Department Center  07/19/2023  8:50 AM [facility] NURSE [facility]  07/19/2023  9:10 AM Eure, Vonn DEL, MD [facility]   Follow up Visit:  Follow-up Information     [facility] Follow up in 4 week(s).   Contact information: [address] [place]  [id number] [phone]                    07/14/2023 [name], CNM

## 2023-07-14 NOTE — MAU Note (Signed)
 MAU Labor Triage Note:  .Michelle Larson is a 29 y.o. at [redacted]w[redacted]d here in MAU reporting:  Contractions every: 10 minutes Onset of ctx: on-going Pain Score: 10-Worst pain ever Pain Location: Abdomen  ROM: denies Vaginal Bleeding: denies Last SVE: 5 cm in office yesterday Labor Pain Management Plan: IV pain medication and Nitrous Oxide  GBS: Positive  Fetal Movement: Reports positive FM FHT:  Bypass triage  Lab orders placed from triage: MAU Labor Eval OB Office: Faculty

## 2023-07-14 NOTE — H&P (Signed)
 Michelle Larson is a 28 y.o. female presenting for active labor  Pregnancy has been followed at [facility] and remarkable for :  Patient Active Problem List   Diagnosis Date Noted   Abnormal Pap smear of cervix 04/28/2023   GBS (group b Streptococcus) UTI complicating pregnancy, second trimester 02/19/2023   BMI 36.0-36.9,adult 02/02/2023   Asymptomatic bacteriuria during pregnancy in first trimester 02/02/2023   History of postpartum hemorrhage 01/05/2023   Encounter for supervision of normal pregnancy, antepartum 12/31/2022  Postpartum blood loss was just over .  . OB History     Gravida  5   Para  3   Term  3   Preterm      AB  1   Living  3      SAB  1   IAB      Ectopic      Multiple  0   Live Births  3          Past Medical History:  Diagnosis Date   Medical history non-contributory    Past Surgical History:  Procedure Laterality Date   CHOLECYSTECTOMY     Family History: family history includes Diabetes in her maternal grandfather and mother; Hyperlipidemia in her maternal grandmother. Social History:  reports that she has never smoked. She has never used smokeless tobacco. She reports that she does not currently use alcohol. She reports that she does not use drugs.     Maternal Diabetes: No Genetic Screening: Normal Maternal Ultrasounds/Referrals: Normal Fetal Ultrasounds or other Referrals:  None Maternal Substance Abuse:  No Significant Maternal Medications:  None Significant Maternal Lab Results:  Group B Strep positive Number of Prenatal Visits:greater than 3 verified prenatal visits Maternal Vaccinations:Flu Other Comments:  None  Review of Systems  Constitutional:  Negative for chills and fever.  Respiratory:  Negative for shortness of breath.   Gastrointestinal:  Positive for abdominal pain.  Genitourinary:  Positive for pelvic pain and vaginal discharge. Negative for vaginal bleeding.  Neurological:  Negative for  headaches.   Maternal Medical History:  Reason for admission: Contractions.   Contractions: Onset was 1-2 hours ago.   Frequency: regular.   Perceived severity is strong.   Fetal activity: Perceived fetal activity is normal.   Prenatal complications: No bleeding or PIH.   Prenatal Complications - Diabetes: none.   Dilation: 7 Effacement (%): 80 Station: 0 Exam by:: Benton Medley, RN Blood pressure 131/71, pulse 92, temperature 97.8 F (36.6 C), temperature source Oral, resp. rate 16, height 5' 2 (1.575 m), weight 97.1 kg, last menstrual period 09/02/2022, SpO2 99%. Maternal Exam:  Uterine Assessment: Contraction strength is firm.  Contraction frequency is regular.  Abdomen: Patient reports no abdominal tenderness. Fetal presentation: vertex Introitus: Normal vulva. Pelvis: adequate for delivery.   Cervix: Cervix evaluated by digital exam.     Fetal Exam Fetal Monitor Review: Mode: ultrasound.   Baseline rate: 140.  Variability: moderate (6-25 bpm).   Pattern: accelerations present.   Fetal State Assessment: Category I - tracings are normal.   Physical Exam Constitutional:      General: She is not in acute distress.    Appearance: She is not toxic-appearing.  HENT:     Head: Normocephalic.  Cardiovascular:     Rate and Rhythm: Normal rate.  Pulmonary:     Effort: Pulmonary effort is normal.  Genitourinary:    General: Normal vulva.  Musculoskeletal:        General: Normal range of motion.  Cervical back: Normal range of motion.  Skin:    General: Skin is warm and dry.  Neurological:     General: No focal deficit present.     Mental Status: She is alert.  Psychiatric:        Mood and Affect: Mood normal.     Prenatal labs: ABO, Rh: O/Positive/-- (12/31 1601) Antibody: Negative (04/23 0838) Rubella: 1.42 (12/31 1601) RPR: Non Reactive (04/23 0838)  HBsAg: Negative (12/31 1601)  HIV: Non Reactive (04/23 0838)  GBS:     Assessment/Plan: S[place] IUP  at [redacted]w[redacted]d Active Labor GBS positive History small PPH  Admit to Labor and Delivery Routine orders Anticipate SVD    [name] 07/14/2023, 1:35 AM

## 2023-07-14 NOTE — Progress Notes (Signed)
 Patient ID: Michelle Larson, female   DOB: July 10, 1995, 28 y.o.   MRN: [id number] Doing well Using Nitrous Declines other analgesia  Vitals:   07/14/23 0425 07/14/23 0540  BP: 124/67 125/69  Pulse: (!) 101 (!) 104  Resp:  18  Temp:    SpO2:     AROM clear fluid Dilation: 7 Effacement (%): 80 Cervical Position: Anterior Station: -1, 0 Presentation: Vertex Exam by:: Coran Dipaola,CNM  Will anticipate increased labor

## 2023-07-14 NOTE — Plan of Care (Signed)

## 2023-07-14 NOTE — Lactation Note (Addendum)
 This note was copied from a baby's chart. Lactation Consultation Note  Patient Name: Michelle Larson Date: 07/14/2023 Age:29 hours Reason for consult: Initial assessment;Term  P4, Mother breastfed her last child using a nipple shield. Mother called for assistance and requested a nipple shield, since she used one with her last child. Mother hand expressed good flow.  Assisted with latching.  Baby latched with ease and sustained latch for 12 min.  Pointed out to mother numerous swallows. Discussed that baby can latch easy without NS. Mother states her nipples are tender.  Provided coconut oil. Demonstrated how to use #20NS as requested. Mother states she may use it during cluster feeding. Discussed that if she is using if occasionally to stimulate her breasts with hand pump for 10 min but if she is using it every feeding, she will need to start pumping with DEBP. Request to be set up as needed.   Maternal Data Has patient been taught Hand Expression?: Yes Does the patient have breastfeeding experience prior to this delivery?: Yes How long did the patient breastfeed?: breastfed and pumped  Feeding Mother's Current Feeding Choice: Breast Milk  LATCH Score Latch: Grasps breast easily, tongue down, lips flanged, rhythmical sucking.  Audible Swallowing: A few with stimulation  Type of Nipple: Everted at rest and after stimulation  Comfort (Breast/Nipple): Filling, red/small blisters or bruises, mild/mod discomfort  Hold (Positioning): Assistance needed to correctly position infant at breast and maintain latch.  LATCH Score: 7   Lactation Tools Discussed/Used  Manual pump  Interventions Interventions: Breast feeding basics reviewed;Assisted with latch;Skin to skin;Hand express;Hand pump;Education;CDC milk storage guidelines;LC Services brochure  Discharge Pump: Personal [place])  Consult Status Consult Status: Follow-up Date: 07/15/23 Follow-up type:  In-patient   Shannon Dines Boschen  RN, IBCLC 07/14/2023, 2:15 PM

## 2023-07-15 MED ORDER — IBUPROFEN 600 MG PO TABS: 600.0000 mg | ORAL_TABLET | Freq: Four times a day (QID) | ORAL | 0 refills | Status: AC | PRN

## 2023-07-15 NOTE — Discharge Instructions (Signed)

## 2023-07-15 NOTE — Plan of Care (Signed)
 Problem: Education: Goal: Knowledge of General Education information will improve Description: Including pain rating scale, medication(s)/side effects and non-pharmacologic comfort measures 07/15/2023 0730 by Madison Rosina LABOR, LPN Outcome: Adequate for Discharge 07/15/2023 0714 by Madison Rosina LABOR, LPN Outcome: Progressing   Problem: Health Behavior/Discharge Planning: Goal: Ability to manage health-related needs will improve 07/15/2023 0730 by Madison Rosina LABOR, LPN Outcome: Adequate for Discharge 07/15/2023 0714 by Madison Rosina LABOR, LPN Outcome: Progressing   Problem: Clinical Measurements: Goal: Ability to maintain clinical measurements within normal limits will improve 07/15/2023 0730 by Madison Rosina LABOR, LPN Outcome: Adequate for Discharge 07/15/2023 0714 by Madison Rosina LABOR, LPN Outcome: Progressing Goal: Will remain free from infection 07/15/2023 0730 by Madison Rosina LABOR, LPN Outcome: Adequate for Discharge 07/15/2023 0714 by Madison Rosina LABOR, LPN Outcome: Progressing Goal: Diagnostic test results will improve 07/15/2023 0730 by Madison Rosina LABOR, LPN Outcome: Adequate for Discharge 07/15/2023 0714 by Madison Rosina LABOR, LPN Outcome: Progressing Goal: Respiratory complications will improve 07/15/2023 0730 by Madison Rosina LABOR, LPN Outcome: Adequate for Discharge 07/15/2023 0714 by Madison Rosina LABOR, LPN Outcome: Progressing Goal: Cardiovascular complication will be avoided 07/15/2023 0730 by Madison Rosina LABOR, LPN Outcome: Adequate for Discharge 07/15/2023 0714 by Madison Rosina LABOR, LPN Outcome: Progressing   Problem: Activity: Goal: Risk for activity intolerance will decrease 07/15/2023 0730 by Madison Rosina LABOR, LPN Outcome: Adequate for Discharge 07/15/2023 0714 by Madison Rosina LABOR, LPN Outcome: Progressing   Problem: Nutrition: Goal: Adequate nutrition will be maintained 07/15/2023 0730 by Madison Rosina LABOR, LPN Outcome: Adequate for Discharge 07/15/2023 0714 by Madison Rosina LABOR, LPN Outcome: Progressing    Problem: Coping: Goal: Level of anxiety will decrease 07/15/2023 0730 by Madison Rosina LABOR, LPN Outcome: Adequate for Discharge 07/15/2023 0714 by Madison Rosina LABOR, LPN Outcome: Progressing   Problem: Elimination: Goal: Will not experience complications related to bowel motility 07/15/2023 0730 by Madison Rosina LABOR, LPN Outcome: Adequate for Discharge 07/15/2023 0714 by Madison Rosina LABOR, LPN Outcome: Progressing Goal: Will not experience complications related to urinary retention 07/15/2023 0730 by Madison Rosina LABOR, LPN Outcome: Adequate for Discharge 07/15/2023 0714 by Madison Rosina LABOR, LPN Outcome: Progressing   Problem: Pain Managment: Goal: General experience of comfort will improve and/or be controlled 07/15/2023 0730 by Madison Rosina LABOR, LPN Outcome: Adequate for Discharge 07/15/2023 0714 by Madison Rosina LABOR, LPN Outcome: Progressing   Problem: Safety: Goal: Ability to remain free from injury will improve 07/15/2023 0730 by Madison Rosina LABOR, LPN Outcome: Adequate for Discharge 07/15/2023 0714 by Madison Rosina LABOR, LPN Outcome: Progressing   Problem: Skin Integrity: Goal: Risk for impaired skin integrity will decrease 07/15/2023 0730 by Madison Rosina LABOR, LPN Outcome: Adequate for Discharge 07/15/2023 0714 by Madison Rosina LABOR, LPN Outcome: Progressing   Problem: Education: Goal: Knowledge of condition will improve 07/15/2023 0730 by Madison Rosina LABOR, LPN Outcome: Adequate for Discharge 07/15/2023 0714 by Madison Rosina LABOR, LPN Outcome: Progressing Goal: Individualized Educational Video(s) 07/15/2023 0730 by Madison Rosina LABOR, LPN Outcome: Adequate for Discharge 07/15/2023 0714 by Madison Rosina LABOR, LPN Outcome: Progressing Goal: Individualized Newborn Educational Video(s) 07/15/2023 0730 by Madison Rosina LABOR, LPN Outcome: Adequate for Discharge 07/15/2023 0714 by Madison Rosina LABOR, LPN Outcome: Progressing   Problem: Activity: Goal: Will verbalize the importance of balancing activity with adequate rest  periods 07/15/2023 0730 by Madison Rosina LABOR, LPN Outcome: Adequate for Discharge 07/15/2023 9285 by Madison Rosina LABOR, LPN Outcome: Progressing Goal: Ability to tolerate increased activity will improve 07/15/2023 0730 by Madison Rosina LABOR, LPN  Outcome: Adequate for Discharge 07/15/2023 9285 by Madison Rosina LABOR, LPN Outcome: Progressing   Problem: Coping: Goal: Ability to identify and utilize available resources and services will improve 07/15/2023 0730 by Madison Rosina LABOR, LPN Outcome: Adequate for Discharge 07/15/2023 9285 by Madison Rosina LABOR, LPN Outcome: Progressing   Problem: Life Cycle: Goal: Chance of risk for complications during the postpartum period will decrease 07/15/2023 0730 by Madison Rosina LABOR, LPN Outcome: Adequate for Discharge 07/15/2023 0714 by Madison Rosina LABOR, LPN Outcome: Progressing   Problem: Role Relationship: Goal: Ability to demonstrate positive interaction with newborn will improve 07/15/2023 0730 by Madison Rosina LABOR, LPN Outcome: Adequate for Discharge 07/15/2023 0714 by Madison Rosina LABOR, LPN Outcome: Progressing   Problem: Skin Integrity: Goal: Demonstration of wound healing without infection will improve 07/15/2023 0730 by Madison Rosina LABOR, LPN Outcome: Adequate for Discharge 07/15/2023 0714 by Madison Rosina LABOR, LPN Outcome: Progressing

## 2023-07-15 NOTE — Lactation Note (Signed)
 This note was copied from a baby's chart. Lactation Consultation Note  Patient Name: Michelle Larson Date: 07/15/2023 Age:30 hours Reason for consult: Follow-up assessment  P4, Baby awake and mother wanted assistance with latching after NP assessment. Noted positional stripe on her L nipple.  Discussed making sure baby latches with good depth.  She states the L nipple is tender.  She has coconut oil.  Provided shells to wear.  Had mother prepump with manual pump to help evert R nipple. Baby latched with wide open gape and good depth.  Noted sucking burst and then baby remained latched but did not suck after recent bottle of 30 ml of formula. Discussed offering the breast before formula to help establish mother's milk supply. Mother has pump at home and plans to pump. Reviewed engorgement care and monitoring voids/stools.  Maternal Data Has patient been taught Hand Expression?: Yes  Feeding Mother's Current Feeding Choice: Breast Milk and Formula  LATCH Score Latch: Grasps breast easily, tongue down, lips flanged, rhythmical sucking.  Audible Swallowing: A few with stimulation  Type of Nipple: Everted at rest and after stimulation  Comfort (Breast/Nipple): Filling, red/small blisters or bruises, mild/mod discomfort  Hold (Positioning): Assistance needed to correctly position infant at breast and maintain latch.  LATCH Score: 7   Lactation Tools Discussed/Used Tools: Flanges;Pump;Coconut oil Flange Size: 18 Breast pump type: Manual Reason for Pumping: prepump Pumping frequency: PRN  Interventions Interventions: Breast feeding basics reviewed;Assisted with latch;Skin to skin;Hand express;Pre-pump if needed;Hand pump;Education  Discharge Discharge Education: Engorgement and breast care;Warning signs for feeding baby Pump: Personal  Consult Status Consult Status: Complete Date: 07/15/23    Shannon Dines [facility] 07/15/2023, 9:28 AM

## 2023-07-15 NOTE — Plan of Care (Signed)

## 2023-07-19 ENCOUNTER — Encounter: Admitting: Obstetrics & Gynecology

## 2023-07-19 ENCOUNTER — Other Ambulatory Visit

## 2023-07-27 ENCOUNTER — Telehealth (HOSPITAL_COMMUNITY): Payer: Self-pay

## 2023-07-27 NOTE — Telephone Encounter (Signed)
 07/27/2023 1402  Name: Michelle Larson MRN: [id number] DOB: 01/15/1995  Reason for Call:  Transition of Care Hospital Discharge Call  Contact Status: Patient Contact Status: Message  Language assistant needed:          Follow-Up Questions:    Van Postnatal Depression Scale:  In the Past 7 Days:    PHQ2-9 Depression Scale:     Discharge Follow-up:    Post-discharge interventions: NA  Signature  Rosaline Deretha PEAK

## 2023-08-23 ENCOUNTER — Encounter: Payer: Self-pay | Admitting: Women's Health

## 2023-08-23 ENCOUNTER — Ambulatory Visit (INDEPENDENT_AMBULATORY_CARE_PROVIDER_SITE_OTHER): Admitting: Women's Health

## 2023-08-23 DIAGNOSIS — R197 Diarrhea, unspecified: Secondary | ICD-10-CM

## 2023-08-23 DIAGNOSIS — Z30018 Encounter for initial prescription of other contraceptives: Secondary | ICD-10-CM

## 2023-08-23 DIAGNOSIS — R87610 Atypical squamous cells of undetermined significance on cytologic smear of cervix (ASC-US): Secondary | ICD-10-CM

## 2023-08-23 MED ORDER — PHEXXI 1.8-1-0.4 % VA GEL: 1.0000 | Freq: Once | VAGINAL | 11 refills | Status: AC

## 2023-08-23 NOTE — Progress Notes (Signed)
 POSTPARTUM VISIT Patient name: Michelle Larson MRN [id number]  Date of birth: [birth year] Chief Complaint:   Postpartum Care  History of Present Illness:   Michelle Larson is a 28 y.o. [phone] Hispanic female being seen today for a postpartum visit. She is 5 weeks postpartum following a spontaneous vaginal delivery at 39.6 gestational weeks. IOL: no, for n/a. Anesthesia: none.  Laceration: none.  Complications: none. Inpatient contraception: no.   Pregnancy uncomplicated. Tobacco use: no. Substance use disorder: no. Last pap smear: 04/22/23 and results were ASCUS w/ HRHPV positive: other (not 16, 18/45). Next pap smear due: 2026 Patient's last menstrual period was 09/02/2022 (approximate).  Postpartum course has been uncomplicated. Bleeding none. Bowel function is abnormal: diarrhea since birth of child. Bladder function is normal. Urinary incontinence? some, fecal incontinence? no Patient is not sexually active. Last sexual activity: prior to birth of baby. Desired contraception: condoms  and offered Phexxi , wants to try. Patient does not want a pregnancy in the future.  Desired family size is 4 children.   Upstream - 08/23/23 1440       Pregnancy Intention Screening   Does the patient want to become pregnant in the next year? No    Does the patient's partner want to become pregnant in the next year? No    Would the patient like to discuss contraceptive options today? No      Contraception Wrap Up   Current Method Abstinence    End Method Abstinence;No Contraception Precautions    Contraception Counseling Provided No         The pregnancy intention screening data noted above was reviewed. Potential methods of contraception were discussed. The patient elected to proceed with Abstinence; No Contraception Precautions.  Edinburgh Postpartum Depression Screening: neg  Edinburgh Postnatal Depression Scale - 08/23/23 1440       Edinburgh Postnatal Depression Scale:  In the Past 7  Days   I have been able to laugh and see the funny side of things. 0    I have looked forward with enjoyment to things. 0    I have blamed myself unnecessarily when things went wrong. 0    I have been anxious or worried for no good reason. 0    I have felt scared or panicky for no good reason. 0    Things have been getting on top of me. 1    I have been so unhappy that I have had difficulty sleeping. 0    I have felt sad or miserable. 0    I have been so unhappy that I have been crying. 0    The thought of harming myself has occurred to me. 0    Edinburgh Postnatal Depression Scale Total 1             01/05/2023    2:57 PM  GAD 7 : Generalized Anxiety Score  Nervous, Anxious, on Edge 0  Control/stop worrying 0  Worry too much - different things 0  Trouble relaxing 0  Restless 0  Easily annoyed or irritable 1  Afraid - awful might happen 0  Total GAD 7 Score 1     Baby's course has been uncomplicated. Baby is feeding by breast: milk supply adequate. Infant has a pediatrician/family doctor? Yes.  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  Pt has material needs met for her and baby: Yes.   Review of Systems:   Pertinent items are noted in HPI Denies Abnormal vaginal discharge w/  itching/odor/irritation, headaches, visual changes, shortness of breath, chest pain, abdominal pain, severe nausea/vomiting, or problems with urination or bowel movements. Pertinent History Reviewed:  Reviewed past medical,surgical, obstetrical and family history.  Reviewed problem list, medications and allergies. OB History  Gravida Para Term Preterm AB Living  5 4 4  1 4   SAB IAB Ectopic Multiple Live Births  1   0 4    # Outcome Date GA Lbr Len/2nd Weight Sex Type Anes PTL Lv  5 Term 07/14/23 [redacted]w[redacted]d  8 lb 4.3 oz (3.75 kg) F Vag-Spont None  LIV  4 Term 05/31/19 [redacted]w[redacted]d 09:31 / 00:01 9 lb 7 oz (4.281 kg) M Vag-Spont None  LIV  3 SAB 06/2018          2 Term 02/18/16 [redacted]w[redacted]d   8 lb 7 oz (3.827 kg) M Vag-Spont  N LIV  1 Term 08/25/12 [redacted]w[redacted]d  8 lb 12 oz (3.969 kg) F Vag-Spont  N LIV   Physical Assessment:   Vitals:   08/23/23 1439  BP: 126/80  Pulse: 72  Weight: 199 lb (90.3 kg)  Height: 5' 2 (1.575 m)  Body mass index is 36.4 kg/m.       Physical Examination:   General appearance: alert, well appearing, and in no distress  Mental status: alert, oriented to person, place, and time  Skin: warm & dry   Cardiovascular: normal heart rate noted   Respiratory: normal respiratory effort, no distress   Breasts: deferred, no complaints   Abdomen: soft, non-tender   Pelvic: examination not indicated. Thin prep pap obtained: No  Rectal: not examined  Extremities: Edema: none   Chaperone: N/A       No results found for this or any previous visit (from the past 24 hours).  Assessment & Plan:  1) Postpartum exam 2) 5 wks s/p spontaneous vaginal delivery 3) breast feeding 4) Depression screening 5) Contraception management: rx Phexxi  to [facility], discussed use 6) Abnormal pap> repeat April 2026 7) Diarrhea> try GI probiotic, if not helping let us  know  Essential components of care per ACOG recommendations:  1.  Mood and well being:  If positive depression screen, discussed and plan developed.  If using tobacco we discussed reduction/cessation and risk of relapse If current substance abuse, we discussed and referral to local resources was offered.   2. Infant care and feeding:  If breastfeeding, discussed returning to work, pumping, breastfeeding-associated pain, guidance regarding return to fertility while lactating if not using another method. If needed, patient was provided with a letter to be allowed to pump q 2-3hrs to support lactation in a private location with access to a refrigerator to store breastmilk.   Recommended that all caregivers be immunized for flu, pertussis and other preventable communicable diseases If pt does not have material needs  met for her/baby, referred to local resources for help obtaining these.  3. Sexuality, contraception and birth spacing Provided guidance regarding sexuality, management of dyspareunia, and resumption of intercourse Discussed avoiding interpregnancy interval <56mths and recommended birth spacing of 18 months  4. Sleep and fatigue Discussed coping options for fatigue and sleep disruption Encouraged family/partner/community support of 4 hrs of uninterrupted sleep to help with mood and fatigue  5. Physical recovery  If pt had a C/S, assessed incisional pain and providing guidance on normal vs prolonged recovery If pt had a laceration, perineal healing and pain reviewed.  If urinary or fecal incontinence, discussed management and referred to PT or uro/gyn if indicated  Patient  is safe to resume physical activity. Discussed attainment of healthy weight.  6.  Chronic disease management Discussed pregnancy complications if any, and their implications for future childbearing and long-term maternal health. Review recommendations for prevention of recurrent pregnancy complications, such as 17 hydroxyprogesterone caproate to reduce risk for recurrent PTB not applicable, or aspirin to reduce risk of preeclampsia not applicable. Pt had GDM: no. If yes, 2hr GTT scheduled: not applicable. Reviewed medications and non-pregnant dosing including consideration of whether pt is breastfeeding using a reliable resource such as LactMed: yes Referred for f/u w/ PCP or subspecialist providers as indicated: not applicable  7. Health maintenance Mammogram at 29yo or earlier if indicated Pap smears as indicated  Meds:  Meds ordered this encounter  Medications   Lactic Ac-Citric Ac-Pot Bitart (PHEXXI ) 1.8-1-0.4 % GEL    Sig: Place 1 Applicatorful vaginally once for 1 dose. Up to 1 hour before sex    Dispense:  180 g    Refill:  11    Follow-up: Return for after 4/17 for pap.   No orders of the defined types  were placed in this encounter.   Suzen JONELLE Fetters CNM, [facility] 08/23/2023 3:07 PM

## 2023-08-24 ENCOUNTER — Ambulatory Visit: Admitting: Obstetrics & Gynecology

## 2024-02-07 ENCOUNTER — Other Ambulatory Visit: Payer: Self-pay | Admitting: Women's Health
